# Patient Record
Sex: Female | Born: 1995 | Race: Black or African American | Hispanic: No | Marital: Single | Smoking: Never smoker
Health system: Southern US, Community
[De-identification: ages and names within clinical notes are randomized; demographics above are authoritative.]

## PROBLEM LIST (undated history)

## (undated) ENCOUNTER — Inpatient Hospital Stay (HOSPITAL_COMMUNITY): Payer: Self-pay

## (undated) DIAGNOSIS — I2699 Other pulmonary embolism without acute cor pulmonale: Secondary | ICD-10-CM

## (undated) DIAGNOSIS — R011 Cardiac murmur, unspecified: Secondary | ICD-10-CM

## (undated) DIAGNOSIS — D649 Anemia, unspecified: Secondary | ICD-10-CM

## (undated) DIAGNOSIS — D6949 Other primary thrombocytopenia: Secondary | ICD-10-CM

## (undated) HISTORY — DX: Other primary thrombocytopenia: D69.49

## (undated) HISTORY — PX: WISDOM TOOTH EXTRACTION: SHX21

## (undated) HISTORY — DX: Anemia, unspecified: D64.9

## (undated) HISTORY — DX: Cardiac murmur, unspecified: R01.1

---

## 2017-06-18 NOTE — L&D Delivery Note (Signed)
Delivery Note At  a viable female was delivered via  (Presentation:ROA ;  ).  APGAR:8 ,9 ; weight  .   Placenta status:Complete , . 3V Cord:  with the following complications:none .    Anesthesia:  Epidural Episiotomy:  N/A Lacerations:  None Suture Repair: 2.0 vicryl rapide Est. Blood Loss (mL):  150cc  Mom to postpartum.  Baby to Couplet care / Skin to Skin.  Diana Lowe 01/30/2018, 10:32 PM

## 2017-10-03 ENCOUNTER — Encounter (HOSPITAL_COMMUNITY): Payer: Self-pay | Admitting: *Deleted

## 2017-10-03 ENCOUNTER — Inpatient Hospital Stay (HOSPITAL_COMMUNITY)
Admission: AD | Admit: 2017-10-03 | Discharge: 2017-10-03 | Discharge status: Against Medical Advice | Payer: Self-pay | Source: Ambulatory Visit | Attending: Family Medicine | Admitting: Family Medicine

## 2017-10-03 DIAGNOSIS — O26892 Other specified pregnancy related conditions, second trimester: Secondary | ICD-10-CM | POA: Insufficient documentation

## 2017-10-03 DIAGNOSIS — Z5321 Procedure and treatment not carried out due to patient leaving prior to being seen by health care provider: Secondary | ICD-10-CM | POA: Insufficient documentation

## 2017-10-03 DIAGNOSIS — Z3A23 23 weeks gestation of pregnancy: Secondary | ICD-10-CM | POA: Insufficient documentation

## 2017-10-03 LAB — URINALYSIS, ROUTINE W REFLEX MICROSCOPIC
Bilirubin Urine: NEGATIVE
Glucose, UA: NEGATIVE mg/dL
HGB URINE DIPSTICK: NEGATIVE
KETONES UR: NEGATIVE mg/dL
Leukocytes, UA: NEGATIVE
NITRITE: NEGATIVE
PROTEIN: NEGATIVE mg/dL
Specific Gravity, Urine: 1.015 (ref 1.005–1.030)
pH: 8 (ref 5.0–8.0)

## 2017-10-03 NOTE — MAU Note (Signed)
[facility] Was told to come here to check on baby . Appointment on May 1. Denies any vag bleeding leaking or pain at this time.

## 2017-10-03 NOTE — MAU Note (Signed)
Pt unable to stay for evaluation. Told secretary she was leaving and would come back later.Left without signing AMA form

## 2017-10-22 ENCOUNTER — Encounter: Payer: Self-pay | Admitting: Student

## 2017-10-30 LAB — OB RESULTS CONSOLE RPR: RPR: NONREACTIVE

## 2017-11-19 LAB — OB RESULTS CONSOLE ABO/RH: RH Type: POSITIVE

## 2017-11-19 LAB — OB RESULTS CONSOLE HEPATITIS B SURFACE ANTIGEN: HEP B S AG: NEGATIVE

## 2017-11-19 LAB — OB RESULTS CONSOLE HIV ANTIBODY (ROUTINE TESTING): HIV: NONREACTIVE

## 2017-12-23 LAB — OB RESULTS CONSOLE GBS: GBS: POSITIVE

## 2018-01-29 ENCOUNTER — Telehealth (HOSPITAL_COMMUNITY): Payer: Self-pay | Admitting: *Deleted

## 2018-01-29 ENCOUNTER — Encounter (HOSPITAL_COMMUNITY): Payer: Self-pay | Admitting: *Deleted

## 2018-01-29 NOTE — Telephone Encounter (Signed)
Preadmission screen  

## 2018-01-30 ENCOUNTER — Encounter (HOSPITAL_COMMUNITY): Payer: Self-pay | Admitting: *Deleted

## 2018-01-30 ENCOUNTER — Inpatient Hospital Stay (HOSPITAL_COMMUNITY): Payer: Medicaid Other | Admitting: Anesthesiology

## 2018-01-30 ENCOUNTER — Inpatient Hospital Stay (HOSPITAL_COMMUNITY)
Admission: AD | Admit: 2018-01-30 | Discharge: 2018-02-01 | DRG: 807 | Disposition: A | Discharge status: Home / Self Care | Payer: Medicaid Other | Attending: Obstetrics and Gynecology | Admitting: Obstetrics and Gynecology

## 2018-01-30 ENCOUNTER — Other Ambulatory Visit: Payer: Self-pay

## 2018-01-30 DIAGNOSIS — D649 Anemia, unspecified: Secondary | ICD-10-CM | POA: Diagnosis present

## 2018-01-30 DIAGNOSIS — Z3A40 40 weeks gestation of pregnancy: Secondary | ICD-10-CM

## 2018-01-30 DIAGNOSIS — O358XX0 Maternal care for other (suspected) fetal abnormality and damage, not applicable or unspecified: Secondary | ICD-10-CM | POA: Diagnosis present

## 2018-01-30 DIAGNOSIS — O9902 Anemia complicating childbirth: Secondary | ICD-10-CM | POA: Diagnosis present

## 2018-01-30 DIAGNOSIS — O99824 Streptococcus B carrier state complicating childbirth: Principal | ICD-10-CM | POA: Diagnosis present

## 2018-01-30 DIAGNOSIS — Z3483 Encounter for supervision of other normal pregnancy, third trimester: Secondary | ICD-10-CM | POA: Diagnosis present

## 2018-01-30 LAB — CBC
HEMATOCRIT: 32.6 % — AB (ref 36.0–46.0)
Hemoglobin: 10.9 g/dL — ABNORMAL LOW (ref 12.0–15.0)
MCH: 28.8 pg (ref 26.0–34.0)
MCHC: 33.4 g/dL (ref 30.0–36.0)
MCV: 86 fL (ref 78.0–100.0)
Platelets: 151 10*3/uL (ref 150–400)
RBC: 3.79 MIL/uL — ABNORMAL LOW (ref 3.87–5.11)
RDW: 13.8 % (ref 11.5–15.5)
WBC: 10.7 10*3/uL — ABNORMAL HIGH (ref 4.0–10.5)

## 2018-01-30 LAB — TYPE AND SCREEN
ABO/RH(D): O POS
Antibody Screen: NEGATIVE

## 2018-01-30 LAB — ABO/RH: ABO/RH(D): O POS

## 2018-01-30 MED ORDER — ACETAMINOPHEN 325 MG PO TABS: 650.0000 mg | ORAL_TABLET | ORAL | Status: DC | PRN

## 2018-01-30 MED ORDER — FLEET ENEMA 7-19 GM/118ML RE ENEM: 1.0000 | ENEMA | RECTAL | Status: DC | PRN

## 2018-01-30 MED ORDER — FENTANYL 2.5 MCG/ML BUPIVACAINE 1/10 % EPIDURAL INFUSION (WH - ANES)
14.0000 mL/h | INTRAMUSCULAR | Status: DC | PRN
  Administered 2018-01-30: 14 mL/h via EPIDURAL
  Filled 2018-01-30: qty 100

## 2018-01-30 MED ORDER — OXYCODONE-ACETAMINOPHEN 5-325 MG PO TABS: 2.0000 | ORAL_TABLET | ORAL | Status: DC | PRN

## 2018-01-30 MED ORDER — PHENYLEPHRINE 40 MCG/ML (10ML) SYRINGE FOR IV PUSH (FOR BLOOD PRESSURE SUPPORT)
80.0000 ug | PREFILLED_SYRINGE | INTRAVENOUS | Status: DC | PRN
  Filled 2018-01-30: qty 5

## 2018-01-30 MED ORDER — TETANUS-DIPHTH-ACELL PERTUSSIS 5-2.5-18.5 LF-MCG/0.5 IM SUSP: 0.5000 mL | Freq: Once | INTRAMUSCULAR | Status: DC

## 2018-01-30 MED ORDER — ZOLPIDEM TARTRATE 5 MG PO TABS: 5.0000 mg | ORAL_TABLET | Freq: Every evening | ORAL | Status: DC | PRN

## 2018-01-30 MED ORDER — EPHEDRINE 5 MG/ML INJ
10.0000 mg | INTRAVENOUS | Status: DC | PRN
  Filled 2018-01-30: qty 2

## 2018-01-30 MED ORDER — OXYTOCIN 40 UNITS IN LACTATED RINGERS INFUSION - SIMPLE MED
2.5000 [IU]/h | INTRAVENOUS | Status: DC
  Administered 2018-01-30: 2.5 [IU]/h via INTRAVENOUS
  Filled 2018-01-30: qty 1000

## 2018-01-30 MED ORDER — DIBUCAINE 1 % RE OINT: 1.0000 "application " | TOPICAL_OINTMENT | RECTAL | Status: DC | PRN

## 2018-01-30 MED ORDER — IBUPROFEN 600 MG PO TABS
600.0000 mg | ORAL_TABLET | Freq: Four times a day (QID) | ORAL | Status: DC
  Administered 2018-01-31 – 2018-02-01 (×7): 600 mg via ORAL
  Filled 2018-01-30 (×7): qty 1

## 2018-01-30 MED ORDER — ONDANSETRON HCL 4 MG PO TABS: 4.0000 mg | ORAL_TABLET | ORAL | Status: DC | PRN

## 2018-01-30 MED ORDER — PENICILLIN G 3 MILLION UNITS IVPB - SIMPLE MED
3.0000 10*6.[IU] | INTRAVENOUS | Status: DC
  Administered 2018-01-30 (×2): 3 10*6.[IU] via INTRAVENOUS
  Filled 2018-01-30: qty 100
  Filled 2018-01-30 (×2): qty 3

## 2018-01-30 MED ORDER — ONDANSETRON HCL 4 MG/2ML IJ SOLN: 4.0000 mg | INTRAMUSCULAR | Status: DC | PRN

## 2018-01-30 MED ORDER — LACTATED RINGERS IV SOLN
INTRAVENOUS | Status: DC
  Administered 2018-01-30 (×2): via INTRAVENOUS

## 2018-01-30 MED ORDER — SOD CITRATE-CITRIC ACID 500-334 MG/5ML PO SOLN: 30.0000 mL | ORAL | Status: DC | PRN

## 2018-01-30 MED ORDER — WITCH HAZEL-GLYCERIN EX PADS: 1.0000 "application " | MEDICATED_PAD | CUTANEOUS | Status: DC | PRN

## 2018-01-30 MED ORDER — PRENATAL MULTIVITAMIN CH
1.0000 | ORAL_TABLET | Freq: Every day | ORAL | Status: DC
  Administered 2018-01-31: 1 via ORAL
  Filled 2018-01-30 (×2): qty 1

## 2018-01-30 MED ORDER — LACTATED RINGERS IV SOLN: 500.0000 mL | INTRAVENOUS | Status: DC | PRN

## 2018-01-30 MED ORDER — OXYCODONE-ACETAMINOPHEN 5-325 MG PO TABS: 1.0000 | ORAL_TABLET | ORAL | Status: DC | PRN

## 2018-01-30 MED ORDER — LIDOCAINE HCL (PF) 1 % IJ SOLN
INTRAMUSCULAR | Status: DC | PRN
  Administered 2018-01-30 (×2): 4 mL

## 2018-01-30 MED ORDER — LIDOCAINE HCL (PF) 1 % IJ SOLN
30.0000 mL | INTRAMUSCULAR | Status: DC | PRN
  Filled 2018-01-30: qty 30

## 2018-01-30 MED ORDER — BENZOCAINE-MENTHOL 20-0.5 % EX AERO
1.0000 "application " | INHALATION_SPRAY | CUTANEOUS | Status: DC | PRN
  Administered 2018-01-31: 1 via TOPICAL
  Filled 2018-01-30: qty 56

## 2018-01-30 MED ORDER — LACTATED RINGERS IV SOLN: 500.0000 mL | Freq: Once | INTRAVENOUS | Status: DC

## 2018-01-30 MED ORDER — SENNOSIDES-DOCUSATE SODIUM 8.6-50 MG PO TABS
2.0000 | ORAL_TABLET | ORAL | Status: DC
  Filled 2018-01-30 (×2): qty 2

## 2018-01-30 MED ORDER — SODIUM CHLORIDE 0.9 % IV SOLN
5.0000 10*6.[IU] | Freq: Once | INTRAVENOUS | Status: AC
  Administered 2018-01-30: 5 10*6.[IU] via INTRAVENOUS
  Filled 2018-01-30: qty 5

## 2018-01-30 MED ORDER — COCONUT OIL OIL
1.0000 "application " | TOPICAL_OIL | Status: DC | PRN
  Administered 2018-01-31: 1 via TOPICAL
  Filled 2018-01-30: qty 120

## 2018-01-30 MED ORDER — OXYTOCIN BOLUS FROM INFUSION
500.0000 mL | Freq: Once | INTRAVENOUS | Status: AC
  Administered 2018-01-30: 500 mL via INTRAVENOUS

## 2018-01-30 MED ORDER — DIPHENHYDRAMINE HCL 50 MG/ML IJ SOLN: 12.5000 mg | INTRAMUSCULAR | Status: DC | PRN

## 2018-01-30 MED ORDER — SIMETHICONE 80 MG PO CHEW: 80.0000 mg | CHEWABLE_TABLET | ORAL | Status: DC | PRN

## 2018-01-30 MED ORDER — ONDANSETRON HCL 4 MG/2ML IJ SOLN: 4.0000 mg | Freq: Four times a day (QID) | INTRAMUSCULAR | Status: DC | PRN

## 2018-01-30 MED ORDER — PHENYLEPHRINE 40 MCG/ML (10ML) SYRINGE FOR IV PUSH (FOR BLOOD PRESSURE SUPPORT)
80.0000 ug | PREFILLED_SYRINGE | INTRAVENOUS | Status: DC | PRN
  Filled 2018-01-30: qty 5
  Filled 2018-01-30: qty 10

## 2018-01-30 MED ORDER — DIPHENHYDRAMINE HCL 25 MG PO CAPS: 25.0000 mg | ORAL_CAPSULE | Freq: Four times a day (QID) | ORAL | Status: DC | PRN

## 2018-01-30 NOTE — Anesthesia Preprocedure Evaluation (Signed)
Anesthesia Evaluation  Patient identified by MRN, date of birth, ID band Patient awake    Reviewed: Allergy & Precautions, NPO status , Patient's Chart, lab work & pertinent test results  Airway Mallampati: II  TM Distance: >3 FB Neck ROM: Full    Dental   Pulmonary neg pulmonary ROS,    Pulmonary exam normal breath sounds clear to auscultation       Cardiovascular negative cardio ROS Normal cardiovascular exam Rhythm:Regular Rate:Normal     Neuro/Psych negative neurological ROS  negative psych ROS   GI/Hepatic negative GI ROS, Neg liver ROS,   Endo/Other  negative endocrine ROS  Renal/GU negative Renal ROS     Musculoskeletal negative musculoskeletal ROS (+)   Abdominal   Peds  Hematology  (+) anemia ,   Anesthesia Other Findings   Reproductive/Obstetrics (+) Pregnancy                             Anesthesia Physical Anesthesia Plan  ASA: II  Anesthesia Plan: Epidural   Post-op Pain Management:    Induction:   PONV Risk Score and Plan:   Airway Management Planned:   Additional Equipment:   Intra-op Plan:   Post-operative Plan:   Informed Consent: I have reviewed the patients History and Physical, chart, labs and discussed the procedure including the risks, benefits and alternatives for the proposed anesthesia with the patient or authorized representative who has indicated his/her understanding and acceptance.     Plan Discussed with:   Anesthesia Plan Comments:         Anesthesia Quick Evaluation

## 2018-01-30 NOTE — Anesthesia Pain Management Evaluation Note (Signed)
  CRNA Pain Management Visit Note  Patient: Diana Lowe, 22 y.o., female  "Hello I am a [facility] of the anesthesia [facility]. We have an anesthesia team available at all times to provide care throughout the hospital, including epidural management and anesthesia for C-section. I don't know your plan for the delivery whether it a natural birth, water birth, IV sedation, nitrous supplementation, doula or epidural, but we want to meet your pain goals."   1.Was your pain managed to your expectations on prior hospitalizations?   No prior hospitalizations  2.What is your expectation for pain management during this hospitalization?     Labor support without medications, Epidural and IV pain meds  3.How can we help you reach that goal? Be available;information given  Record the patient's initial score and the patient's pain goal.   Pain: 6  Pain Goal: 9 [facility] wants you to be able to say your pain was always managed very well.  Edison PaceWILKERSON,Makenize Messman 01/30/2018

## 2018-01-30 NOTE — H&P (Signed)
Diana Lowe is a 22 y.o. female presenting for labor. Pt had castor oil last night. Her contractions are q 5 minutes.  No Lof or vb OB History    Gravida  1   Para  0   Term  0   Preterm  0   AB  0   Living  0     SAB  0   TAB  0   Ectopic  0   Multiple  0   Live Births  0          Past Medical History:  Diagnosis Date  . Anemia   . Primary thrombocytopenia (HCC)    Past Surgical History:  Procedure Laterality Date  . WISDOM TOOTH EXTRACTION     Family History: family history includes Diabetes in her maternal grandmother; Hypertension in her maternal grandfather. Social History:  reports that she has never smoked. She has never used smokeless tobacco. She reports that she does not drink alcohol or use drugs.     Maternal Diabetes: No Genetic Screening: Normal Maternal Ultrasounds/Referrals: Normal Fetal Ultrasounds or other Referrals:  Fetal pyelectasis Maternal Substance Abuse:  No Significant Maternal Medications:  None Significant Maternal Lab Results:  None Other Comments:  None   Physical Examination: General appearance - alert, well appearing, and in no distress Chest - clear to auscultation, no wheezes, rales or rhonchi, symmetric air entry Heart - normal rate and regular rhythm Abdomen - soft, nontender, nondistended, no masses or organomegaly Extremities - Homan's sign negative bilaterally  ROS History Dilation: 4 Effacement (%): 80 Station: -2 Exam by:: Diana Lowe Blood pressure 109/62, pulse 64, temperature 98.5 F (36.9 C), temperature source Oral, resp. rate 18, height 5\' 3"  (1.6 m), weight 72.6 kg, SpO2 100 %. Exam Physical Exam  Prenatal labs: ABO, Rh: O/Positive/-- (06/04 0000) Antibody:   Rubella:   RPR: Nonreactive (05/15 0000)  HBsAg: Negative (06/04 0000)  HIV: Non-reactive (06/04 0000)  GBS: Positive (07/08 0000)   Assessment/Plan: Labor at term Will AROM once PCN in  Pt declines pain meds for  now Positive GBS Cat 1 Anticipate SVD   Jaspreet Bodner A 01/30/2018, 1:14 PM

## 2018-01-30 NOTE — MAU Note (Signed)
Pt presents with c/o since 0500 this morning.  Denies LOF or VB.  Reports +FM.

## 2018-01-30 NOTE — Anesthesia Procedure Notes (Signed)
Epidural Patient location during procedure: OB Start time: 01/30/2018 4:49 PM End time: 01/30/2018 5:02 PM  Staffing Anesthesiologist: Lewie LoronGermeroth, Xylah Early, MD Performed: anesthesiologist   Preanesthetic Checklist Completed: patient identified, pre-op evaluation, timeout performed, IV checked, risks and benefits discussed and monitors and equipment checked  Epidural Patient position: sitting Prep: site prepped and draped and DuraPrep Patient monitoring: heart rate, continuous pulse ox and blood pressure Approach: midline Location: L2-L3 Injection technique: LOR air and LOR saline  Needle:  Needle type: Tuohy  Needle gauge: 17 G Needle length: 9 cm Needle insertion depth: 6 cm Catheter type: closed end flexible Catheter size: 19 Gauge Catheter at skin depth: 11 cm Test dose: negative  Assessment Sensory level: T8 Events: blood not aspirated, injection not painful, no injection resistance, negative IV test and no paresthesia  Additional Notes Reason for block:procedure for pain

## 2018-01-30 NOTE — Progress Notes (Signed)
Patient ID: Diana Lowe, female   DOB: 04/23/1996, 22 y.o.   MRN: [id number] PT is having some pain with contractions BP (!) 94/49   Pulse 71   Temp 98.1 F (36.7 C) (Oral)   Resp 17   Ht 5\' 3"  (1.6 m)   Wt 72.6 kg   SpO2 100%   BMI 28.34 kg/m  Cat 1 4-5/90/-1 AROM clear Anticipate SVD

## 2018-01-31 ENCOUNTER — Other Ambulatory Visit: Payer: Self-pay

## 2018-01-31 LAB — CBC
HEMATOCRIT: 28.2 % — AB (ref 36.0–46.0)
HEMOGLOBIN: 9.8 g/dL — AB (ref 12.0–15.0)
MCH: 29.5 pg (ref 26.0–34.0)
MCHC: 34.8 g/dL (ref 30.0–36.0)
MCV: 84.9 fL (ref 78.0–100.0)
Platelets: 143 10*3/uL — ABNORMAL LOW (ref 150–400)
RBC: 3.32 MIL/uL — ABNORMAL LOW (ref 3.87–5.11)
RDW: 13.6 % (ref 11.5–15.5)
WBC: 15.3 10*3/uL — AB (ref 4.0–10.5)

## 2018-01-31 LAB — RPR: RPR: NONREACTIVE

## 2018-01-31 NOTE — Lactation Note (Signed)
This note was copied from a baby's chart. Lactation Consultation Note  Patient Name: Diana Lucretia KernShianne Wingate Luretha RuedFaison Today's Date: 01/31/2018 Reason for consult: Initial assessment;Primapara;1st time breastfeeding;Term  P1 mother whose infant is now 4214 hours old.    Baby sleeping in bassinet as I arrived.  Encouraged mother to feed 8-12 times/24 hours or sooner if baby shows feeding cues.  Reviewed cues.  Suggested hand expression before/after feeds and to save any EBM to feed back to baby.  Colostrum container provided and milk storage times discussed.  Baby started to awaken while I was in room so I  offered to assist with latching and mother accepted.  She stated that the left side is very sore and that both sides hurt when he feeds.  On my gloved finger baby sucked hard and had a very tight suck and small mouth.  Suck training initiated and explained to mother.  With much encouragement baby latched onto the left breast in the football hold.  Mother in pain and I attempted to do a chin tug which did not seem to relieve mother's pain.  Also, he wanted to suck on his upper lip and made sucking noises while at the breast.  Released baby and tried a couple other times without success.  Offered to try the cross cradle hold on the right breast with the same outcome.  Mother's nipples are intact and everted with no breakdown.  Nipples are rounded.  Put baby STS on mother's chest and will try again with the next feed.  Mother will continue to do suck training.  Mother was able to express a few colostrum drops which were fed back to baby.  She will call for latch assistance as needed and continue hand expression and manual pump to help express colostrum and stimulate breasts until baby latches better.  Reminded her to use EBM and coconut oil on nipple and areola.  RN updated.    Maternal Data Formula Feeding for Exclusion: No Has patient been taught Hand Expression?: Yes Does the patient have breastfeeding  experience prior to this delivery?: No  Feeding Feeding Type: Breast Fed  LATCH Score Latch: Repeated attempts needed to sustain latch, nipple held in mouth throughout feeding, stimulation needed to elicit sucking reflex.  Audible Swallowing: None  Type of Nipple: Everted at rest and after stimulation  Comfort (Breast/Nipple): Filling, red/small blisters or bruises, mild/mod discomfort  Hold (Positioning): Assistance needed to correctly position infant at breast and maintain latch.  LATCH Score: 5  Interventions Interventions: Breast feeding basics reviewed;Assisted with latch;Skin to skin;Breast massage;Hand express;Position options;Support pillows;Adjust position;Breast compression;Hand pump  Lactation Tools Discussed/Used     Consult Status Consult Status: Follow-up Date: 02/01/18 Follow-up type: In-patient    Dora SimsBeth R Sourish Allender 01/31/2018, 12:31 PM

## 2018-01-31 NOTE — Progress Notes (Signed)
Post Partum Day 1 Subjective: no complaints, up ad lib, voiding and tolerating PO  Objective: Blood pressure 113/75, pulse 66, temperature 98.7 F (37.1 C), temperature source Oral, resp. rate 18, height 5\' 3"  (1.6 m), weight 72.6 kg, SpO2 99 %, unknown if currently breastfeeding.  Physical Exam:  General: alert, cooperative and appears stated age Lochia: appropriate Uterine Fundus: firm Incision:  DVT Evaluation: No evidence of DVT seen on physical exam.  Recent Labs    01/30/18 1206 01/31/18 0548  HGB 10.9* 9.8*  HCT 32.6* 28.2*    Assessment/Plan: Plan for discharge tomorrow and Breastfeeding   LOS: 1 day   Tyshia Fenter A Bekim Werntz CNM 01/31/2018, 10:18 AM

## 2018-01-31 NOTE — Addendum Note (Signed)
Addendum  created 01/31/18 0806 by Rica Recordsickelton, Sequan Auxier, CRNA   Charge Capture section accepted, Sign clinical note

## 2018-01-31 NOTE — Anesthesia Postprocedure Evaluation (Signed)
Anesthesia Post Note  Patient: Diana Lowe  Procedure(s) Performed: AN AD HOC EPIDURAL     Patient location during evaluation: Mother Baby Anesthesia Type: Epidural Level of consciousness: awake and alert Pain management: pain level controlled Vital Signs Assessment: post-procedure vital signs reviewed and stable Respiratory status: spontaneous breathing, nonlabored ventilation and respiratory function stable Cardiovascular status: stable Postop Assessment: no headache, no backache, epidural receding and patient able to bend at knees Anesthetic complications: no    Last Vitals:  Vitals:   01/31/18 0100 01/31/18 0506  BP: 113/64 105/69  Pulse: (!) 56 (!) 59  Resp: 16 16  Temp: 36.6 C 36.8 C  SpO2: 99% 99%    Last Pain:  Vitals:   01/31/18 0613  TempSrc:   PainSc: 4    Pain Goal: Patients Stated Pain Goal: 3 (01/30/18 1151)               Rica RecordsICKELTON,Rolonda Pontarelli

## 2018-02-01 MED ORDER — FERROUS SULFATE 325 (65 FE) MG PO TABS: 325.0000 mg | ORAL_TABLET | Freq: Every day | ORAL | Status: DC

## 2018-02-01 MED ORDER — IBUPROFEN 600 MG PO TABS: 600.0000 mg | ORAL_TABLET | Freq: Four times a day (QID) | ORAL | 0 refills | Status: DC | PRN

## 2018-02-01 MED ORDER — FERROUS SULFATE 325 (65 FE) MG PO TABS: 325.0000 mg | ORAL_TABLET | Freq: Every day | ORAL | 0 refills | Status: DC

## 2018-02-01 NOTE — Discharge Instructions (Signed)
Postpartum Care After Vaginal Delivery ° °The period of time right after you deliver your newborn is called the postpartum period. °What kind of medical care will I receive? °· You may continue to receive fluids and medicines through an IV tube inserted into one of your veins. °· If an incision was made near your vagina (episiotomy) or if you had some vaginal tearing during delivery, cold compresses may be placed on your episiotomy or your tear. This helps to reduce pain and swelling. °· You may be given a squirt bottle to use when you go to the bathroom. You may use this until you are comfortable wiping as usual. To use the squirt bottle, follow these steps: °? Before you urinate, fill the squirt bottle with warm water. Do not use hot water. °? After you urinate, while you are sitting on the toilet, use the squirt bottle to rinse the area around your urethra and vaginal opening. This rinses away any urine and blood. °? You may do this instead of wiping. As you start healing, you may use the squirt bottle before wiping yourself. Make sure to wipe gently. °? Fill the squirt bottle with clean water every time you use the bathroom. °· You will be given sanitary pads to wear. °How can I expect to feel? °· You may not feel the need to urinate for several hours after delivery. °· You will have some soreness and pain in your abdomen and vagina. °· If you are breastfeeding, you may have uterine contractions every time you breastfeed for up to several weeks postpartum. Uterine contractions help your uterus return to its normal size. °· It is normal to have vaginal bleeding (lochia) after delivery. The amount and appearance of lochia is often similar to a menstrual period in the first week after delivery. It will gradually decrease over the next few weeks to a dry, yellow-brown discharge. For most women, lochia stops completely by 6-8 weeks after delivery. Vaginal bleeding can vary from woman to woman. °· Within the first few  days after delivery, you may have breast engorgement. This is when your breasts feel heavy, full, and uncomfortable. Your breasts may also throb and feel hard, tightly stretched, warm, and tender. After this occurs, you may have milk leaking from your breasts. Your health care provider can help you relieve discomfort due to breast engorgement. Breast engorgement should go away within a few days. °· You may feel more sad or worried than normal due to hormonal changes after delivery. These feelings should not last more than a few days. If these feelings do not go away after several days, speak with your health care provider. °How should I care for myself? °· Tell your health care provider if you have pain or discomfort. °· Drink enough water to keep your urine clear or pale yellow. °· Wash your hands thoroughly with soap and water for at least 20 seconds after changing your sanitary pads, after using the toilet, and before holding or feeding your baby. °· If you are not breastfeeding, avoid touching your breasts a lot. Doing this can make your breasts produce more milk. °· If you become weak or lightheaded, or you feel like you might faint, ask for help before: °? Getting out of bed. °? Showering. °· Change your sanitary pads frequently. Watch for any changes in your flow, such as a sudden increase in volume, a change in color, the passing of large blood clots. If you pass a blood clot from your vagina,   save it to show to your health care provider. Do not flush blood clots down the toilet without having your health care provider look at them. °· Make sure that all your vaccinations are up to date. This can help protect you and your baby from getting certain diseases. You may need to have immunizations done before you leave the hospital. °· If desired, talk with your health care provider about methods of family planning or birth control (contraception). °How can I start bonding with my baby? °Spending as much time as  possible with your baby is very important. During this time, you and your baby can get to know each other and develop a bond. Having your baby stay with you in your room (rooming in) can give you time to get to know your baby. Rooming in can also help you become comfortable caring for your baby. Breastfeeding can also help you bond with your baby. °How can I plan for returning home with my baby? °· Make sure that you have a car seat installed in your vehicle. °? Your car seat should be checked by a certified car seat installer to make sure that it is installed safely. °? Make sure that your baby fits into the car seat safely. °· Ask your health care provider any questions you have about caring for yourself or your baby. Make sure that you are able to contact your health care provider with any questions after leaving the hospital. °This information is not intended to replace advice given to you by your health care provider. Make sure you discuss any questions you have with your health care provider. °Document Released: 04/01/2007 Document Revised: 11/07/2015 Document Reviewed: 05/09/2015 °Elsevier Interactive Patient Education © 2018 [employer] ° ° °Postpartum Depression and Baby Blues °The postpartum period begins right after the birth of a baby. During this time, there is often a great amount of joy and excitement. It is also a time of many changes in the life of the parents. Regardless of how many times a mother gives birth, each child brings new challenges and dynamics to the family. It is not unusual to have feelings of excitement along with confusing shifts in moods, emotions, and thoughts. All mothers are at risk of developing postpartum depression or the "baby blues." These mood changes can occur right after giving birth, or they may occur many months after giving birth. The baby blues or postpartum depression can be mild or severe. Additionally, postpartum depression can go away rather quickly, or it can  be a long-term condition. °What are the causes? °Raised hormone levels and the rapid drop in those levels are thought to be a main cause of postpartum depression and the baby blues. A number of hormones change during and after pregnancy. Estrogen and progesterone usually decrease right after the delivery of your baby. The levels of thyroid hormone and various cortisol steroids also rapidly drop. Other factors that play a role in these mood changes include major life events and genetics. °What increases the risk? °If you have any of the following risks for the baby blues or postpartum depression, know what symptoms to watch out for during the postpartum period. Risk factors that may increase the likelihood of getting the baby blues or postpartum depression include: °· Having a personal or family history of depression. °· Having depression while being pregnant. °· Having premenstrual mood issues or mood issues related to oral contraceptives. °· Having a lot of life stress. °· Having marital conflict. °· Lacking   a social support network. °· Having a baby with special needs. °· Having health problems, such as diabetes. ° °What are the signs or symptoms? °Symptoms of baby blues include: °· Brief changes in mood, such as going from extreme happiness to sadness. °· Decreased concentration. °· Difficulty sleeping. °· Crying spells, tearfulness. °· Irritability. °· Anxiety. ° °Symptoms of postpartum depression typically begin within the first month after giving birth. These symptoms include: °· Difficulty sleeping or excessive sleepiness. °· Marked weight loss. °· Agitation. °· Feelings of worthlessness. °· Lack of interest in activity or food. ° °Postpartum psychosis is a very serious condition and can be dangerous. Fortunately, it is rare. Displaying any of the following symptoms is cause for immediate medical attention. Symptoms of postpartum psychosis include: °· Hallucinations and delusions. °· Bizarre or disorganized  behavior. °· Confusion or disorientation. ° °How is this diagnosed? °A diagnosis is made by an evaluation of your symptoms. There are no medical or lab tests that lead to a diagnosis, but there are various questionnaires that a health care provider may use to identify those with the baby blues, postpartum depression, or psychosis. Often, a screening tool called the Edinburgh Postnatal Depression Scale is used to diagnose depression in the postpartum period. °How is this treated? °The baby blues usually goes away on its own in 1-2 weeks. Social support is often all that is needed. You will be encouraged to get adequate sleep and rest. Occasionally, you may be given medicines to help you sleep. °Postpartum depression requires treatment because it can last several months or longer if it is not treated. Treatment may include individual or group therapy, medicine, or both to address any social, physiological, and psychological factors that may play a role in the depression. Regular exercise, a healthy diet, rest, and social support may also be strongly recommended. °Postpartum psychosis is more serious and needs treatment right away. Hospitalization is often needed. °Follow these instructions at home: °· Get as much rest as you can. Nap when the baby sleeps. °· Exercise regularly. Some women find yoga and walking to be beneficial. °· Eat a balanced and nourishing diet. °· Do little things that you enjoy. Have a cup of tea, take a bubble bath, read your favorite magazine, or listen to your favorite music. °· Avoid alcohol. °· Ask for help with household chores, cooking, grocery shopping, or running errands as needed. Do not try to do everything. °· Talk to people close to you about how you are feeling. Get support from your partner, family members, friends, or other new moms. °· Try to stay positive in how you think. Think about the things you are grateful for. °· Do not spend a lot of time alone. °· Only take  over-the-counter or prescription medicine as directed by your health care provider. °· Keep all your postpartum appointments. °· Let your health care provider know if you have any concerns. °Contact a health care provider if: °You are having a reaction to or problems with your medicine. °Get help right away if: °· You have suicidal feelings. °· You think you may harm the baby or someone else. °This information is not intended to replace advice given to you by your health care provider. Make sure you discuss any questions you have with your health care provider. °Document Released: 03/08/2004 Document Revised: 11/10/2015 Document Reviewed: 03/16/2013 °Elsevier Interactive Patient Education © 2017 [employer] ° ° °Contraception Choices °Contraception, also called birth control, means things to use or ways to try   not to get pregnant. °Hormonal birth control °This kind of birth control uses hormones. Here are some types of hormonal birth control: °· A tube that is put under skin of the arm (implant). The tube can stay in for as long as 3 years. °· Shots to get every 3 months (injections). °· Pills to take every day (birth control pills). °· A patch to change 1 time each week for 3 weeks (birth control patch). After that, the patch is taken off for 1 week. °· A ring to put in the vagina. The ring is left in for 3 weeks. Then it is taken out of the vagina for 1 week. Then a new ring is put in. °· Pills to take after unprotected sex (emergency birth control pills). ° °Barrier birth control °Here are some types of barrier birth control: °· A thin covering that is put on the penis before sex (female condom). The covering is thrown away after sex. °· A soft, loose covering that is put in the vagina before sex (female condom). The covering is thrown away after sex. °· A rubber bowl that sits over the cervix (diaphragm). The bowl must be made for you. The bowl is put into the vagina before sex. The bowl is left in for 6-8 hours  after sex. It is taken out within 24 hours. °· A small, soft cup that fits over the cervix (cervical cap). The cup must be made for you. The cup can be left in for 6-8 hours after sex. It is taken out within 48 hours. °· A sponge that is put into the vagina before sex. It must be left in for at least 6 hours after sex. It must be taken out within 30 hours. Then it is thrown away. °· A chemical that kills or stops sperm from getting into the uterus (spermicide). It may be a pill, cream, jelly, or foam to put in the vagina. The chemical should be used at least 10-15 minutes before sex. ° °IUD (intrauterine) birth control °An IUD is a small, T-shaped piece of plastic. It is put inside the uterus. There are two kinds: °· Hormone IUD. This kind can stay in for 3-5 years. °· Copper IUD. This kind can stay in for 10 years. ° °Permanent birth control °Here are some types of permanent birth control: °· Surgery to block the fallopian tubes. °· Having an insert put into each fallopian tube. °· Surgery to tie off the tubes that carry sperm (vasectomy). ° °Natural planning birth control °Here are some types of natural planning birth control: °· Not having sex on the days the woman could get pregnant. °· Using a calendar: °? To keep track of the length of each period. °? To find out what days pregnancy can happen. °? To plan to not have sex on days when pregnancy can happen. °· Watching for symptoms of ovulation and not having sex during ovulation. One way the woman can check for ovulation is to check her temperature. °· Waiting to have sex until after ovulation. ° °Summary °· Contraception, also called birth control, means things to use or ways to try not to get pregnant. °· Hormonal methods of birth control include implants, injections, pills, patches, vaginal rings, and emergency birth control pills. °· Barrier methods of birth control can include female condoms, female condoms, diaphragms, cervical caps, sponges, and  spermicides. °· There are two types of IUD (intrauterine device) birth control. An IUD can be put in a woman's uterus   to prevent pregnancy for 3-5 years. °· Permanent sterilization can be done through a procedure for males, females, or both. °· Natural planning methods involve not having sex on the days when the woman could get pregnant. °This information is not intended to replace advice given to you by your health care provider. Make sure you discuss any questions you have with your health care provider. °Document Released: 04/01/2009 Document Revised: 06/14/2016 Document Reviewed: 06/14/2016 °Elsevier Interactive Patient Education © 2017 [employer]. ° °

## 2018-02-01 NOTE — Lactation Note (Addendum)
This note was copied from a baby's chart. Lactation Consultation Note  Patient Name: Diana Lucretia KernShianne Wingate Luretha RuedFaison WUJWJ'XToday's Date: 02/01/2018   Baby 35 hours old and sleeping.  Parents states stools transitioning to green and baby cluster fed last night.  Reassured parents  Mother is not using nipple shield and working on deeper latch and flanging lips. Mom has my # to call for assist w/next feeding if needed.  Mom encouraged to feed baby 8-12 times/24 hours and with feeding cues.  Reviewed engorgement care and monitoring voids/stools. Mother is using ebm and coconut oil for sore nipples. Answered questions about pumping.  Mother has manual pump.   Maternal Data    Feeding    LATCH Score                   Interventions    Lactation Tools Discussed/Used     Consult Status      Diana Lowe, Ruth [facility] 02/01/2018, 9:06 AM

## 2018-02-01 NOTE — Discharge Summary (Signed)
OB Discharge Summary     Patient Name: Diana Lowe DOB: 05/16/1996 MRN: [id number]  Date of admission: 01/30/2018 Delivering MD: Kenney HousemanPROTHERO, NANCY JEAN   Date of discharge: 02/01/2018  Admitting diagnosis: 40WKS,CTX Intrauterine pregnancy: 5571w3d     Secondary diagnosis:  Active Problems:   Normal labor   SVD (spontaneous vaginal delivery)  Discharge diagnosis: Term Pregnancy Delivered                                                                                                Post partum procedures:n/a  Augmentation: AROM  Complications: None  Hospital course:  Onset of Labor With Vaginal Delivery     22 y.o. yo G1P1001 at 171w3d was admitted in Latent Labor on 01/30/2018. Patient had an uncomplicated labor course as follows:  Membrane Rupture Time/Date: 2:59 PM ,01/30/2018   Intrapartum Procedures: Episiotomy: None [1]                                         Lacerations:  Periurethral [8]  Patient had a delivery of a Viable infant. 01/30/2018  Information for the patient's newborn:  Wingate Alphia KavaFaison, Boy Mireyah [[id number]][[id number]]  Delivery Method: Vaginal, Spontaneous(Filed from Delivery Summary)    Pateint had an uncomplicated postpartum course.  She is ambulating, tolerating a regular diet, passing flatus, and urinating well. Patient is discharged home in stable condition on 02/01/18.   Physical exam  Vitals:   01/31/18 0942 01/31/18 1655 01/31/18 2239 02/01/18 0524  BP: 113/75 115/74 128/72 107/65  Pulse: 66 (!) 57 (!) 58 (!) 52  Resp: 18 18 18 16   Temp: 98.7 F (37.1 C) 98.5 F (36.9 C) 98.4 F (36.9 C) 98.4 F (36.9 C)  TempSrc: Oral Oral Oral Oral  SpO2:      Weight:      Height:       General: alert, cooperative and no distress Lochia: appropriate Uterine Fundus: firm Incision: N/A DVT Evaluation: No evidence of DVT seen on physical exam. Negative Homan's sign. No cords or calf tenderness. No significant calf/ankle edema. Labs: Lab Results   Component Value Date   WBC 15.3 (H) 01/31/2018   HGB 9.8 (L) 01/31/2018   HCT 28.2 (L) 01/31/2018   MCV 84.9 01/31/2018   PLT 143 (L) 01/31/2018   No flowsheet data found.  Discharge instruction: per After Visit Summary and "Baby and Me Booklet".  After visit meds:  Allergies as of 02/01/2018   No Known Allergies     Medication List    TAKE these medications   ferrous sulfate 325 (65 FE) MG tablet Take 1 tablet (325 mg total) by mouth daily with breakfast. Start taking on:  02/02/2018   ibuprofen 600 MG tablet Commonly known as:  ADVIL,MOTRIN Take 1 tablet (600 mg total) by mouth every 6 (six) hours as needed for mild pain or moderate pain.   prenatal multivitamin Tabs tablet Take 1 tablet by mouth daily at 12 noon.       Diet: routine  diet. Patient with mild asymptomatic anemia, will rx daily Iron. Patient may take over the counter stool softener for constipation.   Activity: Advance as tolerated. Pelvic rest for 6 weeks.   Outpatient follow up:6 weeks Follow up Appt:No future appointments. Follow up Visit:No follow-ups on file.  Postpartum contraception: Undecided  Newborn Data: Live born female  Birth Weight: 9 lb 4.5 oz (4210 g) APGAR: 8, 9  Newborn Delivery   Birth date/time:  01/30/2018 21:39:00 Delivery type:  Vaginal, Spontaneous     Baby Feeding: Breast Disposition:home with mother   02/01/2018 Janeece RiggersEllis K Idil Maslanka, CNM

## 2018-02-03 ENCOUNTER — Inpatient Hospital Stay (HOSPITAL_COMMUNITY)
Admission: EM | Admit: 2018-02-03 | Discharge: 2018-02-04 | DRG: 776 | Disposition: A | Discharge status: Home / Self Care | Payer: Medicaid Other | Attending: Obstetrics and Gynecology | Admitting: Obstetrics and Gynecology

## 2018-02-03 ENCOUNTER — Encounter (HOSPITAL_COMMUNITY): Payer: Self-pay | Admitting: *Deleted

## 2018-02-03 ENCOUNTER — Other Ambulatory Visit: Payer: Self-pay | Admitting: Oncology

## 2018-02-03 ENCOUNTER — Emergency Department (HOSPITAL_COMMUNITY): Payer: Medicaid Other

## 2018-02-03 ENCOUNTER — Inpatient Hospital Stay (HOSPITAL_COMMUNITY): Payer: Medicaid Other

## 2018-02-03 ENCOUNTER — Other Ambulatory Visit: Payer: Self-pay

## 2018-02-03 ENCOUNTER — Encounter: Payer: Self-pay | Admitting: Advanced Practice Midwife

## 2018-02-03 DIAGNOSIS — O1495 Unspecified pre-eclampsia, complicating the puerperium: Secondary | ICD-10-CM | POA: Diagnosis not present

## 2018-02-03 DIAGNOSIS — R109 Unspecified abdominal pain: Secondary | ICD-10-CM

## 2018-02-03 DIAGNOSIS — O1490 Unspecified pre-eclampsia, unspecified trimester: Secondary | ICD-10-CM | POA: Diagnosis present

## 2018-02-03 DIAGNOSIS — I2699 Other pulmonary embolism without acute cor pulmonale: Secondary | ICD-10-CM

## 2018-02-03 DIAGNOSIS — O8823 Thromboembolism in the puerperium: Secondary | ICD-10-CM | POA: Insufficient documentation

## 2018-02-03 LAB — BASIC METABOLIC PANEL
Anion gap: 9 (ref 5–15)
BUN: 11 mg/dL (ref 6–20)
CO2: 25 mmol/L (ref 22–32)
CREATININE: 0.75 mg/dL (ref 0.44–1.00)
Calcium: 9.3 mg/dL (ref 8.9–10.3)
Chloride: 106 mmol/L (ref 98–111)
GLUCOSE: 80 mg/dL (ref 70–99)
Potassium: 3.9 mmol/L (ref 3.5–5.1)
SODIUM: 140 mmol/L (ref 135–145)

## 2018-02-03 LAB — URINALYSIS, ROUTINE W REFLEX MICROSCOPIC
Bilirubin Urine: NEGATIVE
GLUCOSE, UA: NEGATIVE mg/dL
Ketones, ur: NEGATIVE mg/dL
Nitrite: NEGATIVE
PH: 8 (ref 5.0–8.0)
Protein, ur: NEGATIVE mg/dL
RBC / HPF: >50 RBC/hpf — ABNORMAL HIGH (ref 0–5)
Specific Gravity, Urine: 1.004 — ABNORMAL LOW (ref 1.005–1.030)

## 2018-02-03 LAB — HEPATIC FUNCTION PANEL
ALK PHOS: 188 U/L — AB (ref 38–126)
ALT: 33 U/L (ref 0–44)
AST: 46 U/L — ABNORMAL HIGH (ref 15–41)
Albumin: 3 g/dL — ABNORMAL LOW (ref 3.5–5.0)
BILIRUBIN DIRECT: 0.2 mg/dL (ref 0.0–0.2)
BILIRUBIN INDIRECT: 0.5 mg/dL (ref 0.3–0.9)
Total Bilirubin: 0.7 mg/dL (ref 0.3–1.2)
Total Protein: 6.3 g/dL — ABNORMAL LOW (ref 6.5–8.1)

## 2018-02-03 LAB — PROTIME-INR
INR: 0.9
Prothrombin Time: 12.1 seconds (ref 11.4–15.2)

## 2018-02-03 LAB — PROTEIN / CREATININE RATIO, URINE
CREATININE, URINE: 14.48 mg/dL
Protein Creatinine Ratio: 1.38 mg/mg{Cre} — ABNORMAL HIGH (ref 0.00–0.15)
Total Protein, Urine: 20 mg/dL

## 2018-02-03 LAB — I-STAT TROPONIN, ED: Troponin i, poc: 0.02 ng/mL (ref 0.00–0.08)

## 2018-02-03 LAB — CBC
HCT: 34.7 % — ABNORMAL LOW (ref 36.0–46.0)
Hemoglobin: 11.5 g/dL — ABNORMAL LOW (ref 12.0–15.0)
MCH: 28.1 pg (ref 26.0–34.0)
MCHC: 33.1 g/dL (ref 30.0–36.0)
MCV: 84.8 fL (ref 78.0–100.0)
Platelets: 188 10*3/uL (ref 150–400)
RBC: 4.09 MIL/uL (ref 3.87–5.11)
RDW: 13.7 % (ref 11.5–15.5)
WBC: 7.4 10*3/uL (ref 4.0–10.5)

## 2018-02-03 LAB — BRAIN NATRIURETIC PEPTIDE: B NATRIURETIC PEPTIDE 5: 393.8 pg/mL — AB (ref 0.0–100.0)

## 2018-02-03 LAB — APTT: aPTT: 28 seconds (ref 24–36)

## 2018-02-03 LAB — D-DIMER, QUANTITATIVE: D-Dimer, Quant: 2 ug/mL-FEU — ABNORMAL HIGH (ref 0.00–0.50)

## 2018-02-03 MED ORDER — SIMETHICONE 80 MG PO CHEW: 80.0000 mg | CHEWABLE_TABLET | ORAL | Status: DC | PRN

## 2018-02-03 MED ORDER — ACETAMINOPHEN 325 MG PO TABS
650.0000 mg | ORAL_TABLET | Freq: Once | ORAL | Status: AC
  Administered 2018-02-03: 650 mg via ORAL
  Filled 2018-02-03: qty 2

## 2018-02-03 MED ORDER — HEPARIN BOLUS VIA INFUSION
4000.0000 [IU] | Freq: Once | INTRAVENOUS | Status: AC
  Administered 2018-02-03: 4000 [IU] via INTRAVENOUS
  Filled 2018-02-03: qty 4000

## 2018-02-03 MED ORDER — ACETAMINOPHEN 325 MG PO TABS: 650.0000 mg | ORAL_TABLET | ORAL | Status: DC | PRN

## 2018-02-03 MED ORDER — BENZOCAINE-MENTHOL 20-0.5 % EX AERO: 1.0000 "application " | INHALATION_SPRAY | CUTANEOUS | Status: DC | PRN

## 2018-02-03 MED ORDER — IOPAMIDOL (ISOVUE-370) INJECTION 76%
100.0000 mL | Freq: Once | INTRAVENOUS | Status: AC | PRN
  Administered 2018-02-03: 81 mL via INTRAVENOUS

## 2018-02-03 MED ORDER — OXYCODONE-ACETAMINOPHEN 5-325 MG PO TABS: 2.0000 | ORAL_TABLET | ORAL | Status: DC | PRN

## 2018-02-03 MED ORDER — DIPHENHYDRAMINE HCL 25 MG PO CAPS: 25.0000 mg | ORAL_CAPSULE | Freq: Four times a day (QID) | ORAL | Status: DC | PRN

## 2018-02-03 MED ORDER — SODIUM CHLORIDE 0.9 % IV SOLN: 250.0000 mL | INTRAVENOUS | Status: DC | PRN

## 2018-02-03 MED ORDER — NIFEDIPINE ER OSMOTIC RELEASE 30 MG PO TB24
30.0000 mg | ORAL_TABLET | Freq: Once | ORAL | Status: AC
  Administered 2018-02-03: 30 mg via ORAL
  Filled 2018-02-03: qty 1

## 2018-02-03 MED ORDER — IOPAMIDOL (ISOVUE-370) INJECTION 76%
INTRAVENOUS | Status: AC
  Filled 2018-02-03: qty 100

## 2018-02-03 MED ORDER — ONDANSETRON HCL 4 MG/2ML IJ SOLN: 4.0000 mg | INTRAMUSCULAR | Status: DC | PRN

## 2018-02-03 MED ORDER — HEPARIN (PORCINE) IN NACL 100-0.45 UNIT/ML-% IJ SOLN
13.5000 [IU]/h | INTRAMUSCULAR | Status: DC
  Administered 2018-02-03: 1100 [IU]/h via INTRAVENOUS
  Filled 2018-02-03: qty 250

## 2018-02-03 MED ORDER — ZOLPIDEM TARTRATE 5 MG PO TABS: 5.0000 mg | ORAL_TABLET | Freq: Every evening | ORAL | Status: DC | PRN

## 2018-02-03 MED ORDER — SODIUM CHLORIDE 0.9% FLUSH: 3.0000 mL | INTRAVENOUS | Status: DC | PRN

## 2018-02-03 MED ORDER — SODIUM CHLORIDE 0.9% FLUSH: 3.0000 mL | Freq: Two times a day (BID) | INTRAVENOUS | Status: DC

## 2018-02-03 MED ORDER — OXYCODONE-ACETAMINOPHEN 5-325 MG PO TABS: 1.0000 | ORAL_TABLET | ORAL | Status: DC | PRN

## 2018-02-03 MED ORDER — DIBUCAINE 1 % RE OINT: 1.0000 "application " | TOPICAL_OINTMENT | RECTAL | Status: DC | PRN

## 2018-02-03 MED ORDER — COCONUT OIL OIL: 1.0000 "application " | TOPICAL_OIL | Status: DC | PRN

## 2018-02-03 MED ORDER — WITCH HAZEL-GLYCERIN EX PADS: 1.0000 "application " | MEDICATED_PAD | CUTANEOUS | Status: DC | PRN

## 2018-02-03 MED ORDER — PRENATAL MULTIVITAMIN CH: 1.0000 | ORAL_TABLET | Freq: Every day | ORAL | Status: DC

## 2018-02-03 MED ORDER — SENNOSIDES-DOCUSATE SODIUM 8.6-50 MG PO TABS
2.0000 | ORAL_TABLET | ORAL | Status: DC
  Administered 2018-02-03: 1 via ORAL
  Filled 2018-02-03: qty 2

## 2018-02-03 MED ORDER — ONDANSETRON HCL 4 MG PO TABS: 4.0000 mg | ORAL_TABLET | ORAL | Status: DC | PRN

## 2018-02-03 NOTE — Plan of Care (Signed)
Pt. Condition will continue to improve 

## 2018-02-03 NOTE — ED Notes (Signed)
Bed: WA21 Expected date:  Expected time:  Means of arrival:  Comments: Triage 1 

## 2018-02-03 NOTE — Progress Notes (Signed)
ANTICOAGULATION CONSULT NOTE - Initial Consult  Pharmacy Consult for Heparin  Indication: Postpartum Pulmonary Embolus No Known Allergies  Patient Measurements: Height: 5\' 3"  (160 cm) Weight: 147 lb (66.7 kg) IBW/kg (Calculated) : 52.4   Vital Signs: Temp: 98.6 F (37 C) (08/19 1550) Temp Source: Oral (08/19 1550) BP: 130/94 (08/19 1550) Pulse Rate: 64 (08/19 1550)  Labs: Recent Labs    02/03/18 0630  HGB 11.5*  HCT 34.7*  PLT 188  APTT 28  LABPROT 12.1  INR 0.90  CREATININE 0.75    Estimated Creatinine Clearance: 99.5 mL/min (by C-G formula based on SCr of 0.75 mg/dL).   Medical History: Past Medical History:  Diagnosis Date  . Anemia   . Primary thrombocytopenia (HCC)     Medications:    Assessment: 22yo s/p SVD on 8/15 admitted today with postpartum Left sided PE per Spiral CT. [facility], back pain and dizziness. Plan per MD bote states Heparin drip x 24 hours then convert to Lovenox  Goal of Therapy:  Heparin level goal 0.3-0.7. Monitor platelets by anticoagulation protocol: Yes   Plan:  Heparin bolus 4000 units x 1 (~ 60 units/kg) Then start Heparin drip at 1100 units/hr ( ~ 17units/kg/hr). Will plan to draw 6 hour heparin level ~ 2300 and adjust to maintain desired level.   Claybon Jabsngel, Emmy Keng G 02/03/2018,5:10 PM

## 2018-02-03 NOTE — ED Notes (Signed)
Patient transported to X-ray 

## 2018-02-03 NOTE — Consult Note (Signed)
This note was interrupted by a storm and had to be redone

## 2018-02-03 NOTE — ED Notes (Signed)
US at bedside

## 2018-02-03 NOTE — ED Provider Notes (Addendum)
[place] DEPT Provider Note   CSN: [id number] Arrival date & time: 02/03/18  5400     History   Chief Complaint Chief Complaint  Patient presents with  . Chest Pain  . Dizziness    HPI Diana Lowe Diana Lowe is a 22 y.o. female.  HPI 22 year old G61, P94 female postpartum day 4 comes in with chief complaint of chest pain, back pain and dizziness. Patient reports that she was awake with her baby at 5 AM and she started suffering with chest discomfort.  Her chest pain is described as heaviness and pressure and it is located midsternally in the upper part of her chest.  The pain is radiating to her back, and towards her neck.  Patient denies any associated nausea, vomiting, fevers, chills, cough, shortness of breath.  Review of system is positive for dizziness and a mild headache (3/10).  Patient denies any abdominal pain or UTI-like symptoms.  Patient is noted to have elevated blood pressure.  She reports that her pregnancy was uncomplicated and she delivered vaginally at term.  Patient does not have any history of essential hypertension and she denies any substance abuse history.  Past Medical History:  Diagnosis Date  . Anemia   . Primary thrombocytopenia [facility])     Patient Active Problem List   Diagnosis Date Noted  . Preeclampsia 02/03/2018  . Normal labor 01/30/2018  . SVD (spontaneous vaginal delivery) 01/30/2018    Past Surgical History:  Procedure Laterality Date  . WISDOM TOOTH EXTRACTION       OB History    Gravida  1   Para  1   Term  1   Preterm  0   AB  0   Living  1     SAB  0   TAB  0   Ectopic  0   Multiple  0   Live Births  1            Home Medications    Prior to Admission medications   Medication Sig Start Date End Date Taking? Authorizing Provider  ferrous sulfate 325 (65 FE) MG tablet Take 1 tablet (325 mg total) by mouth daily with breakfast. 02/02/18  Yes Marikay Alar, CNM  ibuprofen  (ADVIL,MOTRIN) 600 MG tablet Take 1 tablet (600 mg total) by mouth every 6 (six) hours as needed for mild pain or moderate pain. 02/01/18  Yes Marikay Alar, CNM  Prenatal Vit-Fe Fumarate-FA (PRENATAL MULTIVITAMIN) TABS tablet Take 1 tablet by mouth daily at 12 noon.   Yes [provider]    Family History Family History  Problem Relation Age of Onset  . Diabetes Maternal Grandmother   . Hypertension Maternal Grandfather     Social History Social History   Tobacco Use  . Smoking status: Never Smoker  . Smokeless tobacco: Never Used  Substance Use Topics  . Alcohol use: Never    Frequency: Never  . Drug use: Never     Allergies   Patient has no known allergies.   Review of Systems Review of Systems  Constitutional: Positive for activity change.  Respiratory: Positive for shortness of breath.   Cardiovascular: Positive for chest pain.  Neurological: Positive for dizziness and headaches.     Physical Exam Updated Vital Signs BP (!) 145/93   Pulse (!) 57   Temp 98.6 F (37 C) (Oral)   Resp 18   Ht _0  (1.6 m)   Wt 67 kg   SpO2  100%   Breastfeeding? Yes Comment: DOD 01/30/2018  BMI 26.18 kg/m   Physical Exam  Constitutional: She is oriented to person, place, and time. She appears well-developed.  HENT:  Head: Normocephalic and atraumatic.  Eyes: Pupils are equal, round, and reactive to light. EOM are normal.  Neck: Normal range of motion. Neck supple.  Cardiovascular: Normal rate, intact distal pulses and normal pulses.  Pulmonary/Chest: Effort normal. She has no decreased breath sounds.  Abdominal: Bowel sounds are normal.  Musculoskeletal:       Right lower leg: She exhibits no tenderness and no edema.       Left lower leg: She exhibits no tenderness and no edema.  Subtle right lower extremity swelling  Neurological: She is alert and oriented to person, place, and time. She is not disoriented. No cranial nerve deficit.  Cerebellar exam is normal  (finger to nose) Sensory exam normal for bilateral upper and lower extremities - and patient is able to discriminate between sharp and dull. Motor exam is 4+/5   Skin: Skin is warm and dry.  Nursing note and vitals reviewed.    ED Treatments / Results  Labs (all labs ordered are listed, but only abnormal results are displayed) Labs Reviewed  CBC - Abnormal; Notable for the following components:      Result Value   Hemoglobin 11.5 (*)    HCT 34.7 (*)    All other components within normal limits  HEPATIC FUNCTION PANEL - Abnormal; Notable for the following components:   Total Protein 6.3 (*)    Albumin 3.0 (*)    AST 46 (*)    Alkaline Phosphatase 188 (*)    All other components within normal limits  URINALYSIS, ROUTINE W REFLEX MICROSCOPIC - Abnormal; Notable for the following components:   Specific Gravity, Urine 1.004 (*)    Hgb urine dipstick LARGE (*)    Leukocytes, UA MODERATE (*)    RBC / HPF >50 (*)    Bacteria, UA RARE (*)    All other components within normal limits  BRAIN NATRIURETIC PEPTIDE - Abnormal; Notable for the following components:   B Natriuretic Peptide 393.8 (*)    All other components within normal limits  D-DIMER, QUANTITATIVE (NOT AT [facility]) - Abnormal; Notable for the following components:   D-Dimer, Quant 2.00 (*)    All other components within normal limits  PROTEIN / CREATININE RATIO, URINE - Abnormal; Notable for the following components:   Protein Creatinine Ratio 1.38 (*)    All other components within normal limits  BASIC METABOLIC PANEL  APTT  PROTIME-INR  I-STAT TROPONIN, ED  I-STAT TROPONIN, ED    EKG EKG Interpretation  Date/Time:  Monday February 03 2018 06:34:19 EDT Ventricular Rate:  63 PR Interval:    QRS Duration: 87 QT Interval:  404 QTC Calculation: 414 R Axis:   29 Text Interpretation:  Sinus rhythm Probable left atrial enlargement ST elev, probable normal early repol pattern No previous ECGs available Reconfirmed by  Varney Biles [phone]) on 02/03/2018 7:24:18 AM Also confirmed by Varney Biles [phone]), editor Lynder Parents [phone])  on 02/03/2018 9:09:42 AM   Radiology Dg Chest 2 View  Result Date: 02/03/2018 CLINICAL DATA:  Initial evaluation for acute chest pain. EXAM: CHEST - 2 VIEW COMPARISON:  None. FINDINGS: The cardiac and mediastinal silhouettes are within normal limits. The lungs are normally inflated. No airspace consolidation, pleural effusion, or pulmonary edema is identified. There is no pneumothorax. No acute osseous abnormality identified. IMPRESSION: No  active cardiopulmonary disease. Electronically Signed   By: Jeannine Boga M.D.   On: 02/03/2018 06:59   Ct Angio Chest Pe W And/or Wo Contrast  Result Date: 02/03/2018 CLINICAL DATA:  Intermediate probability for pulmonary embolism. Chest pain starting 2 hours ago. Positive D-dimer. Recent delivery EXAM: CT ANGIOGRAPHY CHEST WITH CONTRAST TECHNIQUE: Multidetector CT imaging of the chest was performed using the standard protocol during bolus administration of intravenous contrast. Multiplanar CT image reconstructions and MIPs were obtained to evaluate the vascular anatomy. CONTRAST:  79m ISOVUE-370 IOPAMIDOL (ISOVUE-370) INJECTION 76% COMPARISON:  None. FINDINGS: Cardiovascular: There is a calcified nodular density directly central to a left lower lobe subsegmental vessel at the level of the lateral basal segment with a somewhat branching appearance. No other pulmonary artery filling defect is seen. Normal heart size. No pericardial effusion. Mediastinum/Nodes: Negative for adenopathy or mass. Prominent thymus, likely physiologic. Lungs/Pleura: The central airways are clear. Small sub pleural ground-glass density immediately associated with the abnormal vessels described above. Elsewhere reticulation and interlobular septal thickening in the left lower lobe is attributed atelectasis given volume loss. Upper Abdomen: Negative Musculoskeletal:  No acute or aggressive finding These results were called by telephone at the time of interpretation on 02/03/2018 at 11:34 am to Dr. AVarney Biles, who verbally acknowledged these results. No history of vertebroplasty. Review of the MIP images confirms the above findings. IMPRESSION: 1. Subsegmental calcified pulmonary embolism in the left lower lobe. Calcification favors a chronic process but associated subpleural ground-glass density and history is more indicative of an acute embolism. Suggest venous imaging to exclude a calcified thrombus. 2. Mild left lower lobe atelectasis superimposed on the above. Electronically Signed   By: JMonte FantasiaM.D.   On: 02/03/2018 11:36   UKoreaAbdomen Limited Ruq  Result Date: 02/03/2018 CLINICAL DATA:  Abdominal pain. EXAM: ULTRASOUND ABDOMEN LIMITED RIGHT UPPER QUADRANT COMPARISON:  None. FINDINGS: Gallbladder: No gallstones or wall thickening visualized. No sonographic Murphy sign noted by sonographer. Common bile duct: Diameter: 1.7 mm Liver: No focal lesion identified. Within normal limits in parenchymal echogenicity. Portal vein is patent on color Doppler imaging with normal direction of blood flow towards the liver. IMPRESSION: Normal right upper quadrant ultrasound. Electronically Signed   By: HKathreen Devoid  On: 02/03/2018 08:56    Procedures Procedures (including critical care time)  CRITICAL CARE Performed by: Eniya Cannady   Total critical care time: 95 minutes  Critical care time was exclusive of separately billable procedures and treating other patients.  Critical care was necessary to treat or prevent imminent or life-threatening deterioration.  Critical care was time spent personally by me on the following activities: development of treatment plan with patient and/or surrogate as well as nursing, discussions with consultants, evaluation of patient's response to treatment, examination of patient, obtaining history from patient or surrogate,  ordering and performing treatments and interventions, ordering and review of laboratory studies, ordering and review of radiographic studies, pulse oximetry and re-evaluation of patient's condition.   Medications Ordered in ED Medications  iopamidol (ISOVUE-370) 76 % injection (has no administration in time range)  acetaminophen (TYLENOL) tablet 650 mg (650 mg Oral Given 02/03/18 0820)  NIFEdipine (PROCARDIA-XL/ADALAT-CC/NIFEDICAL-XL) 24 hr tablet 30 mg (30 mg Oral Given 02/03/18 1124)  iopamidol (ISOVUE-370) 76 % injection 100 mL (81 mLs Intravenous Contrast Given 02/03/18 1056)     Initial Impression / Assessment and Plan / ED Course  I have reviewed the triage vital signs and the nursing notes.  Pertinent labs &  imaging results that were available during my care of the patient were reviewed by me and considered in my medical decision making (see chart for details).  Clinical Course as of Feb 04 1319  Mon Feb 03, 2018  0904 Headache is resolved after Tylenol.   [AN]  0904 AST is slightly elevated, alk phos is at 180.  Patient had a negative right upper quadrant ultrasound. At this time consult will be made for [facility] [place] [facility]  Alkaline Phosphatase(!): 188 [AN]  0910 Dimer is elevated, predictably because patient just delivered a baby.  CT PE ordered  -as we do not have a good examination for the chest pain radiating to the back.  D-Dimer, Quant(!): 2.00 [AN]  1053 I spoke with Cyndi Bender, [place] [facility]. We discussed patient's elevated blood pressure with chest discomfort and slightly abnormal LFTs.  We also discussed the patient's heart rate being in the low 50s-60s with her elevated blood pressure and suspicion for early preeclampsia.  Ms. page staff the patient with Dr. Charlesetta Garibaldi.  They agree with the plan to get the CT PE completed.  [facility].  They recommend that we give Procardia 30 mg extended release at this time.    [AN]  1054 Results from the ER workup discussed with the patient face to face and all questions answered to the best of my ability.    [AN]  1318 Results from the ER workup discussed with the patient face to face and all questions answered to the best of my ability.  CT PE does show a left-sided PE. Admitting team has been notified. Heparin will be started.  Ultrasound vascular to be completed before patient is transferred.  CT Angio Chest PE W and/or Wo Contrast [AN]    Clinical Course User Index [AN] Varney Biles, MD   22 year old female comes in with chief complaint of chest pain that is radiating to her back.  She also has positional component to the pain, it is worse when she is laying flat.  Pain does not appear to be pleuritic in nature, but patient does have associated dizziness and a headache.  Additionally, patient is noted to be hypertensive.  Patient's abdominal exam is reassuring, neuro exam is within normal limits.  Differential diagnosis includes preeclampsia -early onset within the spectrum of the disease. Additionally, cholelithiasis can present with atypical back pain, therefore that is in the differential diagnosis along with PE, pericarditis, postpartum myopericarditis.  Patient does not have any dissection risk factor -there is no family history of premature CAD, or musculoskeletal disorders either..  Final Clinical Impressions(s) / ED Diagnoses   Final diagnoses:  Abdominal pain  Acute pulmonary embolism without acute cor pulmonale, unspecified pulmonary embolism type (HCC)  Preeclampsia in postpartum period    ED Discharge Orders    None       Varney Biles, MD 02/03/18 [place], Raivyn Kabler, MD 02/03/18 1320

## 2018-02-03 NOTE — H&P (Signed)
Majorie Wingate Luretha RuedFaison is an 22 y.o. female. Admitted for PE diagnosed by spiral CT.  No headache, RUQ pain or visual changes.  Pt denies andy family history of VTE, she is not a smoker and never used OCPS.  She had SVD 8/15  Pertinent Gynecological History: Menses: flow is moderate Bleeding: na Contraception: none DES exposure: denies Blood transfusions: none Sexually transmitted diseases: no past history Previous GYN Procedures: na  Last mammogram: na Date:  Last pap: normal Date: 2019 OB History: G1, P1   Menstrual History: Menarche age: [age] No LMP recorded.    Past Medical History:  Diagnosis Date  . Anemia   . Primary thrombocytopenia (HCC)     Past Surgical History:  Procedure Laterality Date  . WISDOM TOOTH EXTRACTION      Family History  Problem Relation Age of Onset  . Diabetes Maternal Grandmother   . Hypertension Maternal Grandfather     Social History:  reports that she has never smoked. She has never used smokeless tobacco. She reports that she does not drink alcohol or use drugs.  Allergies: No Known Allergies  Medications Prior to Admission  Medication Sig Dispense Refill Last Dose  . ferrous sulfate 325 (65 FE) MG tablet Take 1 tablet (325 mg total) by mouth daily with breakfast. 90 tablet 0 02/02/2018 at Unknown time  . ibuprofen (ADVIL,MOTRIN) 600 MG tablet Take 1 tablet (600 mg total) by mouth every 6 (six) hours as needed for mild pain or moderate pain. 30 tablet 0 02/02/2018 at Unknown time  . Prenatal Vit-Fe Fumarate-FA (PRENATAL MULTIVITAMIN) TABS tablet Take 1 tablet by mouth daily at 12 noon.   02/02/2018 at Unknown time    ROS  Blood pressure (!) 130/94, pulse 64, temperature 98.6 F (37 C), temperature source Oral, resp. rate 18, height 5\' 3"  (1.6 m), weight 66.7 kg, SpO2 100 %, currently breastfeeding. Physical Exam  Physical Examination: General appearance - alert, well appearing, and in no distress Mental status - alert, oriented to  person, place, and time Chest - clear to auscultation, no wheezes, rales or rhonchi, symmetric air entry Heart - normal rate and regular rhythm Abdomen - soft, nontender, nondistended, no masses or organomegaly Fundus firm and NT Neurological - alert, oriented, normal speech, no focal findings or movement disorder noted Extremities - pedal edema 1 +, Homan's sign negative bilaterally 2 plus DTR B  Results for orders placed or performed during the hospital encounter of 02/03/18 (from the past 24 hour(s))  Basic metabolic panel     Status: None   Collection Time: 02/03/18  6:30 AM  Result Value Ref Range   Sodium 140 135 - 145 mmol/L   Potassium 3.9 3.5 - 5.1 mmol/L   Chloride 106 98 - 111 mmol/L   CO2 25 22 - 32 mmol/L   Glucose, Bld 80 70 - 99 mg/dL   BUN 11 6 - 20 mg/dL   Creatinine, Ser 1.610.75 0.44 - 1.00 mg/dL   Calcium 9.3 8.9 - 09.610.3 mg/dL   GFR calc non Af Amer >60 >60 mL/min   GFR calc Af Amer >60 >60 mL/min   Anion gap 9 5 - 15  CBC     Status: Abnormal   Collection Time: 02/03/18  6:30 AM  Result Value Ref Range   WBC 7.4 4.0 - 10.5 K/uL   RBC 4.09 3.87 - 5.11 MIL/uL   Hemoglobin 11.5 (L) 12.0 - 15.0 g/dL   HCT 04.534.7 (L) 40.936.0 - 81.146.0 %  MCV 84.8 78.0 - 100.0 fL   MCH 28.1 26.0 - 34.0 pg   MCHC 33.1 30.0 - 36.0 g/dL   RDW 09.813.7 11.911.5 - 14.715.5 %   Platelets 188 150 - 400 K/uL  Hepatic function panel     Status: Abnormal   Collection Time: 02/03/18  6:30 AM  Result Value Ref Range   Total Protein 6.3 (L) 6.5 - 8.1 g/dL   Albumin 3.0 (L) 3.5 - 5.0 g/dL   AST 46 (H) 15 - 41 U/L   ALT 33 0 - 44 U/L   Alkaline Phosphatase 188 (H) 38 - 126 U/L   Total Bilirubin 0.7 0.3 - 1.2 mg/dL   Bilirubin, Direct 0.2 0.0 - 0.2 mg/dL   Indirect Bilirubin 0.5 0.3 - 0.9 mg/dL  APTT     Status: None   Collection Time: 02/03/18  6:30 AM  Result Value Ref Range   aPTT 28 24 - 36 seconds  Protime-INR     Status: None   Collection Time: 02/03/18  6:30 AM  Result Value Ref Range   Prothrombin  Time 12.1 11.4 - 15.2 seconds   INR 0.90   Brain natriuretic peptide     Status: Abnormal   Collection Time: 02/03/18  6:30 AM  Result Value Ref Range   B Natriuretic Peptide 393.8 (H) 0.0 - 100.0 pg/mL  D-dimer, quantitative (not [facility])     Status: Abnormal   Collection Time: 02/03/18  6:30 AM  Result Value Ref Range   D-Dimer, Quant 2.00 (H) 0.00 - 0.50 ug/mL-FEU  I-stat troponin, ED     Status: None   Collection Time: 02/03/18  7:23 AM  Result Value Ref Range   Troponin i, poc 0.02 0.00 - 0.08 ng/mL   Comment 3          Urinalysis, Routine w reflex microscopic     Status: Abnormal   Collection Time: 02/03/18  7:23 AM  Result Value Ref Range   Color, Urine YELLOW YELLOW   APPearance CLEAR CLEAR   Specific Gravity, Urine 1.004 (L) 1.005 - 1.030   pH 8.0 5.0 - 8.0   Glucose, UA NEGATIVE NEGATIVE mg/dL   Hgb urine dipstick LARGE (A) NEGATIVE   Bilirubin Urine NEGATIVE NEGATIVE   Ketones, ur NEGATIVE NEGATIVE mg/dL   Protein, ur NEGATIVE NEGATIVE mg/dL   Nitrite NEGATIVE NEGATIVE   Leukocytes, UA MODERATE (A) NEGATIVE   RBC / HPF >50 (H) 0 - 5 RBC/hpf   WBC, UA 21-50 0 - 5 WBC/hpf   Bacteria, UA RARE (A) NONE SEEN   Squamous Epithelial / LPF 0-5 0 - 5  Protein / creatinine ratio, urine     Status: Abnormal   Collection Time: 02/03/18  7:23 AM  Result Value Ref Range   Creatinine, Urine 14.48 mg/dL   Total Protein, Urine 20 mg/dL   Protein Creatinine Ratio 1.38 (H) 0.00 - 0.15 mg/mg[Cre]    Dg Chest 2 View  Result Date: 02/03/2018 CLINICAL DATA:  Initial evaluation for acute chest pain. EXAM: CHEST - 2 VIEW COMPARISON:  None. FINDINGS: The cardiac and mediastinal silhouettes are within normal limits. The lungs are normally inflated. No airspace consolidation, pleural effusion, or pulmonary edema is identified. There is no pneumothorax. No acute osseous abnormality identified. IMPRESSION: No active cardiopulmonary disease. Electronically Signed   By: Rise MuBenjamin  McClintock M.D.    On: 02/03/2018 06:59   Ct Angio Chest Pe W And/or Wo Contrast  Result Date: 02/03/2018 CLINICAL DATA:  Intermediate  probability for pulmonary embolism. Chest pain starting 2 hours ago. Positive D-dimer. Recent delivery EXAM: CT ANGIOGRAPHY CHEST WITH CONTRAST TECHNIQUE: Multidetector CT imaging of the chest was performed using the standard protocol during bolus administration of intravenous contrast. Multiplanar CT image reconstructions and MIPs were obtained to evaluate the vascular anatomy. CONTRAST:  81mL ISOVUE-370 IOPAMIDOL (ISOVUE-370) INJECTION 76% COMPARISON:  None. FINDINGS: Cardiovascular: There is a calcified nodular density directly central to a left lower lobe subsegmental vessel at the level of the lateral basal segment with a somewhat branching appearance. No other pulmonary artery filling defect is seen. Normal heart size. No pericardial effusion. Mediastinum/Nodes: Negative for adenopathy or mass. Prominent thymus, likely physiologic. Lungs/Pleura: The central airways are clear. Small sub pleural ground-glass density immediately associated with the abnormal vessels described above. Elsewhere reticulation and interlobular septal thickening in the left lower lobe is attributed atelectasis given volume loss. Upper Abdomen: Negative Musculoskeletal: No acute or aggressive finding These results were called by telephone at the time of interpretation on 02/03/2018 at 11:34 am to Dr. Derwood Kaplan , who verbally acknowledged these results. No history of vertebroplasty. Review of the MIP images confirms the above findings. IMPRESSION: 1. Subsegmental calcified pulmonary embolism in the left lower lobe. Calcification favors a chronic process but associated subpleural ground-glass density and history is more indicative of an acute embolism. Suggest venous imaging to exclude a calcified thrombus. 2. Mild left lower lobe atelectasis superimposed on the above. Electronically Signed   By: Marnee Spring  M.D.   On: 02/03/2018 11:36   US Abdomen Limited Ruq  Result Date: 02/03/2018 CLINICAL DATA:  Abdominal pain. EXAM: ULTRASOUND ABDOMEN LIMITED RIGHT UPPER QUADRANT COMPARISON:  None. FINDINGS: Gallbladder: No gallstones or wall thickening visualized. No sonographic Murphy sign noted by sonographer. Common bile duct: Diameter: 1.7 mm Liver: No focal lesion identified. Within normal limits in parenchymal echogenicity. Portal vein is patent on color Doppler imaging with normal direction of blood flow towards the liver. IMPRESSION: Normal right upper quadrant ultrasound. Electronically Signed   By: Elige Ko   On: 02/03/2018 08:56    Assessment/Plan: PP PE Hematology consult Keep heparin drip for 24 hours then start on lovenox  Preeclampsia with mild features.  Start on procardia and draw labs in the am.  Will hold on magnesium for now  Pt with some chest pressure.  Pulse ox normal will do pain management.  Follow closely.    Did speak with MFM briefly who agrees with the plan  Chukwudi Ewen A 02/03/2018, 4:11 PM

## 2018-02-03 NOTE — Lactation Note (Signed)
[facility]  Called to [facility] to speak with mother about breast milk volumes.  Mother is a readmit and is concerned that she does not have enough breastmilk for baby.  Mother is pumping approximately 3 oz from each breast every time she pumps.  I reinforced that she is already pumping large volumes for day of life 4.  She has been using her EBM to bottle feed baby due to sore nipples.  Her nipples are intact with slight irritation but nothing significant.  I suggested using EBM to rub into nipple/areola after feeding and/or pumping.  I also provided comfort gels with instructions for use.  Mother desires to put baby back to breast.  I reinforced the importance of stopping artificial nipples and starting to put baby back to breast at the next feeding with the first sign of feeding cues.  Mother verbalized understanding.  She stated that he is too fussy to latch.  I reminded her to watch carefully for the feeding cues and to latch before the fussiness begins.  I suggested if she cannot get baby to be calm for latching she may give a couple mls of EBM via syringe to "settle" him down and then latch.  Do as much breastfeeding as possible.  Continue feeding 8-12 times/24 hours or sooner if he shows cues.  She will use the comfort gels in between feedings throughout the night.  Encouraged her to call for latch assistance as needed.  Family present.  RN updated.

## 2018-02-03 NOTE — Consult Note (Signed)
[place]  Telephone:([phone]) [phone] Fax:(336) [phone]     ID: Diana Lowe DOB: 1996/05/01  MR#: [id number]  TDH#:[id number]  Patient Care Team: Patient, No Pcp Per as PCP - General (General Practice) Crawford Givens, MD as Consulting Physician (Obstetrics and Gynecology) Chauncey Cruel, MD OTHER MD:  CHIEF COMPLAINT: Pulmonary embolism  CURRENT TREATMENT: Intravenous heparin   HISTORY OF CURRENT ILLNESS: Diana Lowe is a 22 year old [place] woman currently day 5 from uncomplicated delivery of a baby boy 01/30/2018.  This morning, 02/03/2018, around 5 AM, she woke up with back pain.  This changed to a chest pressure sensation, not associated with shortness of breath, cough, or pleurisy.  This was not relieved by change in position or by trying reflux medications as was suggested by primary care.  Accordingly she presented to the emergency room approximately 6:30 in the morning where she was found to be hypertensive and to have some swelling of the left lower extremity.  Bilateral Doppler ultrasonography showed no evidence of DVT.  However a CT angiogram of the chest showed a calcified clot in the left lower lobe with associated subpleural changes.  The patient was started on intravenous heparin and we were consulted to evaluate for possible hypercoagulable state.  INTERVAL HISTORY: I met with the patient in her room at [facility] 02/03/2018.  At the end of the visit the patient and significant other and their baby arrived  REVIEW OF SYSTEMS: Currently the chest pressure sensation has completely resolved.  She had a mild headache this morning which also has resolved and there have been no worrisome or persistent headaches.  She denies cough, fever, or hemoptysis.  There has been no change in bowel or bladder habits.  She generally exercises at [employer] but in the last few months she has not been that active physically.  A detailed review of  systems today was otherwise noncontributory  PAST MEDICAL HISTORY: Past Medical History:  Diagnosis Date  . Anemia   . Primary thrombocytopenia (HCC)   Left lower extremity pulmonary embolism  PAST SURGICAL HISTORY: Past Surgical History:  Procedure Laterality Date  . WISDOM TOOTH EXTRACTION      FAMILY HISTORY Family History  Problem Relation Age of Onset  . Diabetes Maternal Grandmother   . Hypertension Maternal Grandfather   As of August 2019 the patient's father is 76 years old.  The patient's mother is 58 years old.  He lives in [place].  She lives in [place].  The patient has 1 full brothers, 4/2 brothers and 2 half sisters.  She is not aware of any history of clotting or bleeding disorders in the family including no history of strokes or heart attacks.  GYNECOLOGIC HISTORY:  Her periods have always been very irregular although in the last year they came around once a month, between 3 and 5 weeks apart.   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  After her boyfriend moved to this area she came to [place].  She is not currently working.  His name is  Psychologist, prison and probation services and he works in accounts payable locally.  At home it just the 2 of them and their baby Diana Lowe.  The patient's mother in law Diana Lowe  lives nearby  ADVANCED DIRECTIVES: Not in place   HEALTH MAINTENANCE: Social History   Tobacco Use  . Smoking status: Never Smoker  . Smokeless tobacco: Never Used  Substance Use Topics  . Alcohol use: Never    Frequency: Never  . Drug use: Never       No Known Allergies  Current Facility-Administered Medications  Medication Dose Route Frequency Provider Last Rate Last Dose  . sodium chloride flush (NS) 0.9 % injection 3 mL  3 mL  Intravenous Q12H Dillard, Naima, MD       And  . sodium chloride flush (NS) 0.9 % injection 3 mL  3 mL Intravenous PRN Dillard, Naima, MD       And  . 0.9 %  sodium chloride infusion  250 mL Intravenous PRN Dillard, Naima, MD      . acetaminophen (TYLENOL) tablet 650 mg  650 mg Oral Q4H PRN Dillard, Naima, MD      . benzocaine-Menthol (DERMOPLAST) 20-0.5 % topical spray 1 application  1 application Topical PRN Dillard, Naima, MD      . coconut oil  1 application Topical PRN Dillard, Naima, MD      . witch hazel-glycerin (TUCKS) pad 1 application  1 application Topical PRN Dillard, Naima, MD       And  . dibucaine (NUPERCAINAL) 1 % rectal ointment 1 application  1 application Rectal PRN Dillard, Naima, MD      . diphenhydrAMINE (BENADRYL) capsule 25 mg  25 mg Oral Q6H PRN Dillard, Naima, MD      . heparin ADULT infusion 100 units/mL (25000 units/233m sodium chloride 0.45%)  1,100 Units/hr Intravenous Continuous Dillard, Naima, MD 11 mL/hr at 02/03/18 1719 1,100 Units/hr at 02/03/18 1719  . iopamidol (ISOVUE-370) 76 % injection           . ondansetron (ZOFRAN) tablet 4 mg  4 mg Oral Q4H PRN Dillard, Naima, MD       Or  . ondansetron (ZOFRAN) injection 4 mg  4 mg Intravenous Q4H PRN Dillard, Naima, MD      . oxyCODONE-acetaminophen (PERCOCET/ROXICET) 5-325 MG per tablet 1 tablet  1 tablet Oral Q4H PRN Dillard, Naima, MD      . oxyCODONE-acetaminophen (PERCOCET/ROXICET) 5-325 MG per tablet 2 tablet  2 tablet Oral Q4H PRN DCrawford Givens MD      . [Derrill MemoON 02/04/2018] prenatal multivitamin tablet 1 tablet  1 tablet Oral Q1200 Dillard, Naima, MD      . [Derrill MemoON 02/04/2018] senna-docusate (Senokot-S) tablet 2 tablet  2 tablet Oral Q24H Dillard, Naima, MD      . simethicone (MYLICON) chewable tablet 80 mg  80 mg Oral PRN Dillard, Naima, MD      . zolpidem (AMBIEN) tablet 5 mg  5 mg Oral QHS PRN DCrawford Givens MD        OBJECTIVE: Young African-American woman examined at bedside  Vitals:    02/03/18 1427 02/03/18 1550  BP:  (!) 130/94  Pulse: 60 64  Resp: 13 18  Temp:  98.6 F (37 C)  SpO2: 99% 100%     Body mass index is 26.04 kg/m.   Wt Readings from Last 3 Encounters:  02/03/18 147 lb (66.7 kg)  01/30/18 160 lb (72.6 kg)  10/03/17 135 lb (61.2 kg)      ECOG FS:1 - Symptomatic but completely ambulatory  Ocular: Sclerae unicteric, EOMs intact Lymphatic: No cervical or supraclavicular adenopathy Lungs no rales or rhonchi, no wheezes Heart regular rate and rhythm Abd  soft, nontender, positive bowel sounds MSK 1+ left lower extremity edema without erythema Neuro: non-focal, well-oriented, appropriate affect Breasts: Deferred   LAB RESULTS:  CMP     Component Value Date/Time   NA 140 02/03/2018 0630   K 3.9 02/03/2018 0630   CL 106 02/03/2018 0630   CO2 25 02/03/2018 0630   GLUCOSE 80 02/03/2018 0630   BUN 11 02/03/2018 0630   CREATININE 0.75 02/03/2018 0630   CALCIUM 9.3 02/03/2018 0630   PROT 6.3 (L) 02/03/2018 0630   ALBUMIN 3.0 (L) 02/03/2018 0630   AST 46 (H) 02/03/2018 0630   ALT 33 02/03/2018 0630   ALKPHOS 188 (H) 02/03/2018 0630   BILITOT 0.7 02/03/2018 0630   GFRNONAA >60 02/03/2018 0630   GFRAA >60 02/03/2018 0630    No results found for: TOTALPROTELP, ALBUMINELP, A1GS, A2GS, BETS, BETA2SER, GAMS, MSPIKE, SPEI  No results found for: KPAFRELGTCHN, LAMBDASER, KAPLAMBRATIO  Lab Results  Component Value Date   WBC 7.4 02/03/2018   HGB 11.5 (L) 02/03/2018   HCT 34.7 (L) 02/03/2018   MCV 84.8 02/03/2018   PLT 188 02/03/2018    _0 @  No results found for: LABCA2  No components found for: QXIHWT888  Recent Labs  Lab 02/03/18 0630  INR 0.90    No results found for: LABCA2  No results found for: KCM034  No results found for: JZP915  No results found for: AVW979  No results found for: CA2729  No components found for: HGQUANT  No results found for: CEA1 / No results found for: CEA1   No results found for:  AFPTUMOR  No results found for: CHROMOGRNA  No results found for: PSA1  Admission on 02/03/2018  Component Date Value Ref Range Status  . Sodium 02/03/2018 140  135 - 145 mmol/L Final  . Potassium 02/03/2018 3.9  3.5 - 5.1 mmol/L Final  . Chloride 02/03/2018 106  98 - 111 mmol/L Final  . CO2 02/03/2018 25  22 - 32 mmol/L Final  . Glucose, Bld 02/03/2018 80  70 - 99 mg/dL Final  . BUN 02/03/2018 11  6 - 20 mg/dL Final  . Creatinine, Ser 02/03/2018 0.75  0.44 - 1.00 mg/dL Final  . Calcium 02/03/2018 9.3  8.9 - 10.3 mg/dL Final  . GFR calc non Af Amer 02/03/2018 >60  >60 mL/min Final  . GFR calc Af Amer 02/03/2018 >60  >60 mL/min Final   Comment: (NOTE) The eGFR has been calculated using the CKD EPI equation. This calculation has not been validated in all clinical situations. eGFR's persistently <60 mL/min signify possible Chronic Kidney Disease.   Georgiann Hahn gap 02/03/2018 9  5 - 15 Final   Performed at [facility], [place] [address] [place], [place] [address]  . WBC 02/03/2018 7.4  4.0 - 10.5 K/uL Final  . RBC 02/03/2018 4.09  3.87 - 5.11 MIL/uL Final  . Hemoglobin 02/03/2018 11.5* 12.0 - 15.0 g/dL Final  . HCT 02/03/2018 34.7* 36.0 - 46.0 % Final  . MCV 02/03/2018 84.8  78.0 - 100.0 fL Final  . MCH 02/03/2018 28.1  26.0 - 34.0 pg Final  . MCHC 02/03/2018 33.1  30.0 - 36.0 g/dL Final  . RDW 02/03/2018 13.7  11.5 - 15.5 % Final  . Platelets 02/03/2018 188  150 - 400 K/uL Final   Performed at [facility], [place] [address], [place], [place] [address]  . Troponin i, poc 02/03/2018 0.02  0.00 - 0.08 ng/mL Final  .  Comment 3 02/03/2018          Final   Comment: Due to the release kinetics of cTnI, a negative result within the first hours of the onset of symptoms does not rule out myocardial infarction with certainty. If myocardial infarction is still suspected, repeat the test at appropriate intervals.   . Total Protein 02/03/2018 6.3* 6.5 - 8.1  g/dL Final  . Albumin 02/03/2018 3.0* 3.5 - 5.0 g/dL Final  . AST 02/03/2018 46* 15 - 41 U/L Final  . ALT 02/03/2018 33  0 - 44 U/L Final  . Alkaline Phosphatase 02/03/2018 188* 38 - 126 U/L Final  . Total Bilirubin 02/03/2018 0.7  0.3 - 1.2 mg/dL Final  . Bilirubin, Direct 02/03/2018 0.2  0.0 - 0.2 mg/dL Final  . Indirect Bilirubin 02/03/2018 0.5  0.3 - 0.9 mg/dL Final   Performed at [facility] [place] [address], [place], [place] [address]  . Color, Urine 02/03/2018 YELLOW  YELLOW Final  . APPearance 02/03/2018 CLEAR  CLEAR Final  . Specific Gravity, Urine 02/03/2018 1.004* 1.005 - 1.030 Final  . pH 02/03/2018 8.0  5.0 - 8.0 Final  . Glucose, UA 02/03/2018 NEGATIVE  NEGATIVE mg/dL Final  . Hgb urine dipstick 02/03/2018 LARGE* NEGATIVE Final  . Bilirubin Urine 02/03/2018 NEGATIVE  NEGATIVE Final  . Ketones, ur 02/03/2018 NEGATIVE  NEGATIVE mg/dL Final  . Protein, ur 02/03/2018 NEGATIVE  NEGATIVE mg/dL Final  . Nitrite 02/03/2018 NEGATIVE  NEGATIVE Final  . Leukocytes, UA 02/03/2018 MODERATE* NEGATIVE Final  . RBC / HPF 02/03/2018 >50* 0 - 5 RBC/hpf Final  . WBC, UA 02/03/2018 21-50  0 - 5 WBC/hpf Final  . Bacteria, UA 02/03/2018 RARE* NONE SEEN Final  . Squamous Epithelial / LPF 02/03/2018 0-5  0 - 5 Final   Performed at [facility], [place] [address] [place], [place] [address]  . aPTT 02/03/2018 28  24 - 36 seconds Final   Performed at [facility], [place] [address], [place], [place] [address]  . Prothrombin Time 02/03/2018 12.1  11.4 - 15.2 seconds Final  . INR 02/03/2018 0.90   Final   Performed at [facility], [place] [address] [place], [place] [address]  . B Natriuretic Peptide 02/03/2018 393.8* 0.0 - 100.0 pg/mL Final   Performed at [place] [address], [place], [place] [address]  . D-Dimer, Quant 02/03/2018 2.00* 0.00 - 0.50 ug/mL-FEU Final   Comment: (NOTE) At the manufacturer  cut-off of 0.50 ug/mL FEU, this assay has been documented to exclude PE with a sensitivity and negative predictive value of 97 to 99%.  At this time, this assay has not been approved by the FDA to exclude DVT/VTE. Results should be correlated with clinical presentation. Performed at [facility], [place] [address] [place], [place] [address]   . Creatinine, Urine 02/03/2018 14.48  mg/dL Final  . Total Protein, Urine 02/03/2018 20  mg/dL Final   NO NORMAL RANGE ESTABLISHED FOR THIS TEST  . Protein Creatinine Ratio 02/03/2018 1.38* 0.00 - 0.15 mg/mg[Cre] Final   Performed at [place] [address], [place], [place] [address]  Admission on 01/30/2018, Discharged on 02/01/2018  Component Date Value Ref Range Status  . WBC 01/30/2018 10.7* 4.0 - 10.5 K/uL Final  . RBC 01/30/2018 3.79* 3.87 - 5.11 MIL/uL Final  . Hemoglobin 01/30/2018 10.9* 12.0 - 15.0 g/dL Final  . HCT 01/30/2018 32.6* 36.0 - 46.0 %  Final  . MCV 01/30/2018 86.0  78.0 - 100.0 fL Final  . MCH 01/30/2018 28.8  26.0 - 34.0 pg Final  . MCHC 01/30/2018 33.4  30.0 - 36.0 g/dL Final  . RDW 01/30/2018 13.8  11.5 - 15.5 % Final  . Platelets 01/30/2018 151  150 - 400 K/uL Final   Performed at [facility], [address], [place], [place] [address]  . ABO/RH(D) 01/30/2018 O POS   Final  . Antibody Screen 01/30/2018 NEG   Final  . Sample Expiration 01/30/2018    Final                   Value:02/02/2018 Performed at [facility], [address], [place], [place] [address]   . RPR Ser Ql 01/30/2018 Non Reactive  Non Reactive Final   Comment: (NOTE) Performed At: [facility] [address] [place], [place] [id number] Rush Farmer MD WL:[id number]   . ABO/RH(D) 01/30/2018    Final                   Value:O POS Performed at [facility], [address] [place], [place] [address]   . WBC 01/31/2018 15.3* 4.0 - 10.5 K/uL Final  . RBC 01/31/2018 3.32* 3.87 - 5.11  MIL/uL Final  . Hemoglobin 01/31/2018 9.8* 12.0 - 15.0 g/dL Final  . HCT 01/31/2018 28.2* 36.0 - 46.0 % Final  . MCV 01/31/2018 84.9  78.0 - 100.0 fL Final  . MCH 01/31/2018 29.5  26.0 - 34.0 pg Final  . MCHC 01/31/2018 34.8  30.0 - 36.0 g/dL Final  . RDW 01/31/2018 13.6  11.5 - 15.5 % Final  . Platelets 01/31/2018 143* 150 - 400 K/uL Final   Performed at [facility], [address], [place], [place] [address]    (this displays the last labs from the last 3 days)  No results found for: TOTALPROTELP, ALBUMINELP, A1GS, A2GS, BETS, BETA2SER, GAMS, MSPIKE, SPEI (this displays SPEP labs)  No results found for: KPAFRELGTCHN, LAMBDASER, KAPLAMBRATIO (kappa/lambda light chains)  No results found for: HGBA, HGBA2QUANT, HGBFQUANT, HGBSQUAN (Hemoglobinopathy evaluation)   No results found for: LDH  No results found for: IRON, TIBC, IRONPCTSAT (Iron and TIBC)  No results found for: FERRITIN  Urinalysis    Component Value Date/Time   COLORURINE YELLOW 02/03/2018 [place]  02/03/2018 0723   LABSPEC 1.004 (L) 02/03/2018 0723   PHURINE 8.0 02/03/2018 0723   GLUCOSEU NEGATIVE 02/03/2018 0723   HGBUR LARGE (A) 02/03/2018 0723   BILIRUBINUR NEGATIVE 02/03/2018 0723   KETONESUR NEGATIVE 02/03/2018 0723   PROTEINUR NEGATIVE 02/03/2018 0723   NITRITE NEGATIVE 02/03/2018 0723   LEUKOCYTESUR MODERATE (A) 02/03/2018 0723     STUDIES: Dg Chest 2 View  Result Date: 02/03/2018 CLINICAL DATA:  Initial evaluation for acute chest pain. EXAM: CHEST - 2 VIEW COMPARISON:  None. FINDINGS: The cardiac and mediastinal silhouettes are within normal limits. The lungs are normally inflated. No airspace consolidation, pleural effusion, or pulmonary edema is identified. There is no pneumothorax. No acute osseous abnormality identified. IMPRESSION: No active cardiopulmonary disease. Electronically Signed   By: Jeannine Boga M.D.   On: 02/03/2018 06:59   Ct Angio Chest Pe W And/or  Wo Contrast  Result Date: 02/03/2018 CLINICAL DATA:  Intermediate probability for pulmonary embolism. Chest pain starting 2 hours ago. Positive D-dimer. Recent delivery EXAM: CT ANGIOGRAPHY CHEST WITH CONTRAST TECHNIQUE: Multidetector CT imaging of the chest was performed using the standard protocol during bolus administration of  intravenous contrast. Multiplanar CT image reconstructions and MIPs were obtained to evaluate the vascular anatomy. CONTRAST:  61m ISOVUE-370 IOPAMIDOL (ISOVUE-370) INJECTION 76% COMPARISON:  None. FINDINGS: Cardiovascular: There is a calcified nodular density directly central to a left lower lobe subsegmental vessel at the level of the lateral basal segment with a somewhat branching appearance. No other pulmonary artery filling defect is seen. Normal heart size. No pericardial effusion. Mediastinum/Nodes: Negative for adenopathy or mass. Prominent thymus, likely physiologic. Lungs/Pleura: The central airways are clear. Small sub pleural ground-glass density immediately associated with the abnormal vessels described above. Elsewhere reticulation and interlobular septal thickening in the left lower lobe is attributed atelectasis given volume loss. Upper Abdomen: Negative Musculoskeletal: No acute or aggressive finding These results were called by telephone at the time of interpretation on 02/03/2018 at 11:34 am to Dr. AVarney Biles, who verbally acknowledged these results. No history of vertebroplasty. Review of the MIP images confirms the above findings. IMPRESSION: 1. Subsegmental calcified pulmonary embolism in the left lower lobe. Calcification favors a chronic process but associated subpleural ground-glass density and history is more indicative of an acute embolism. Suggest venous imaging to exclude a calcified thrombus. 2. Mild left lower lobe atelectasis superimposed on the above. Electronically Signed   By: JMonte FantasiaM.D.   On: 02/03/2018 11:36   UKoreaAbdomen Limited  Ruq  Result Date: 02/03/2018 CLINICAL DATA:  Abdominal pain. EXAM: ULTRASOUND ABDOMEN LIMITED RIGHT UPPER QUADRANT COMPARISON:  None. FINDINGS: Gallbladder: No gallstones or wall thickening visualized. No sonographic Murphy sign noted by sonographer. Common bile duct: Diameter: 1.7 mm Liver: No focal lesion identified. Within normal limits in parenchymal echogenicity. Portal vein is patent on color Doppler imaging with normal direction of blood flow towards the liver. IMPRESSION: Normal right upper quadrant ultrasound. Electronically Signed   By: HKathreen Devoid  On: 02/03/2018 08:56    ELIGIBLE FOR AVAILABLE RESEARCH PROTOCOL: no  ASSESSMENT: 22y.o. GSimsborowoman presenting 02/03/2018 with a left lower extremity pulmonary embolus 5 days postpartum  PLAN: I spent approximately 60 minutes face to face with Diana Lowe with more than 50% of that time spent in counseling and coordination of care. Specifically we reviewed the biology of the patient's diagnosis and the specifics of her situation.  SKarmynunderstands that at childbirth there is a great risk of severe bleeding.  Accordingly a woman's body does all it can to prepare itself for this event.  Many of those body changes make a person's blood clot more readily than usual.  Sometimes the body overdoes it and the result is abnormal clotting such as for example a clot in the leg which may move to the lungs.  Another way of saying this is that pregnancy and delivery puts one at risk of blood clots.  Some people however are at increased risk of abnormal blood clotting in addition to the normal changes of pregnancy.  Some of these people are born with genetic differences that predispose them to clotting.  Others develop changes which may or may not be genetic but which have the same result.  To find out whether we can identify some of these additional clotting abnormalities she will have a hypercoagulable blood panel drawn tomorrow morning.  It will take  some weeks to get those results so she will return to see me in my office after 3 weeks or so to discuss the final results.  In the meantime she is receiving intravenous heparin which is a blood thinner.  She  can be switched to subcutaneous heparin tomorrow morning and if stable can be discharged home.  I would anticipate she will be on heparin a minimum of 3 months at which time we can assess for discontinuation  Narelle has my card and my office will call her with a specific follow-up appointment.  Please let me know if I can be of further assistance at this point.        Ashleen has a good understanding of the overall plan. She agrees with it. She knows the goal of treatment in her case is cure. She will call with any problems that may develop before her next visit here.  Chauncey Cruel, MD   02/03/2018 5:42 PM Medical Oncology and Hematology [facility] [address] [place], [place] [address] Tel. [phone]    Fax. [phone]

## 2018-02-03 NOTE — ED Triage Notes (Signed)
Pt stated "I just gave birth a couple of days ago.  I started having chest pain about 2 hours ago, then my throat felt like it was getting tight in the middle going up around my chin.  Now my head is hurting and I feel dizzy."

## 2018-02-03 NOTE — ED Notes (Signed)
Attempted to call report to [facility]. RN is in a room with a patient and will call back asap.
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
## 2018-02-03 NOTE — ED Notes (Signed)
ED Provider at bedside. 

## 2018-02-03 NOTE — Progress Notes (Signed)
LE venous duplex prelim: negative for DVT. Wright Gravely Eunice, RDMS, RVT  

## 2018-02-04 LAB — COMPREHENSIVE METABOLIC PANEL
ALBUMIN: 3 g/dL — AB (ref 3.5–5.0)
ALK PHOS: 167 U/L — AB (ref 38–126)
ALT: 34 U/L (ref 0–44)
ANION GAP: 11 (ref 5–15)
AST: 45 U/L — ABNORMAL HIGH (ref 15–41)
BILIRUBIN TOTAL: 0.9 mg/dL (ref 0.3–1.2)
BUN: 11 mg/dL (ref 6–20)
CALCIUM: 8.7 mg/dL — AB (ref 8.9–10.3)
CO2: 21 mmol/L — AB (ref 22–32)
CREATININE: 0.77 mg/dL (ref 0.44–1.00)
Chloride: 104 mmol/L (ref 98–111)
GFR calc Af Amer: >60 mL/min (ref >60)
GFR calc non Af Amer: >60 mL/min (ref >60)
GLUCOSE: 106 mg/dL — AB (ref 70–99)
Potassium: 3.3 mmol/L — ABNORMAL LOW (ref 3.5–5.1)
Sodium: 136 mmol/L (ref 135–145)
TOTAL PROTEIN: 6.1 g/dL — AB (ref 6.5–8.1)

## 2018-02-04 LAB — CBC WITH DIFFERENTIAL/PLATELET
Basophils Absolute: 0 10*3/uL (ref 0.0–0.1)
Basophils Relative: 0 %
Eosinophils Absolute: 0.3 10*3/uL (ref 0.0–0.7)
Eosinophils Relative: 3 %
HEMATOCRIT: 36.6 % (ref 36.0–46.0)
HEMOGLOBIN: 12 g/dL (ref 12.0–15.0)
LYMPHS ABS: 3.7 10*3/uL (ref 0.7–4.0)
Lymphocytes Relative: 39 %
MCH: 28.1 pg (ref 26.0–34.0)
MCHC: 32.8 g/dL (ref 30.0–36.0)
MCV: 85.7 fL (ref 78.0–100.0)
MONOS PCT: 5 %
Monocytes Absolute: 0.4 10*3/uL (ref 0.1–1.0)
NEUTROS ABS: 5.2 10*3/uL (ref 1.7–7.7)
NEUTROS PCT: 53 %
Platelets: 232 10*3/uL (ref 150–400)
RBC: 4.27 MIL/uL (ref 3.87–5.11)
RDW: 13.8 % (ref 11.5–15.5)
WBC: 9.6 10*3/uL (ref 4.0–10.5)

## 2018-02-04 LAB — FERRITIN: Ferritin: 21 ng/mL (ref 11–307)

## 2018-02-04 LAB — RETICULOCYTES
RBC.: 4.27 MIL/uL (ref 3.87–5.11)
RETIC COUNT ABSOLUTE: 81.1 10*3/uL (ref 19.0–186.0)
RETIC CT PCT: 1.9 % (ref 0.4–3.1)

## 2018-02-04 LAB — HEPARIN LEVEL (UNFRACTIONATED)
HEPARIN UNFRACTIONATED: 0.3 [IU]/mL (ref 0.30–0.70)
Heparin Unfractionated: <0.1 IU/mL — ABNORMAL LOW (ref 0.30–0.70)

## 2018-02-04 LAB — LACTATE DEHYDROGENASE: LDH: 225 U/L — ABNORMAL HIGH (ref 98–192)

## 2018-02-04 LAB — URIC ACID: Uric Acid, Serum: 6.5 mg/dL (ref 2.5–7.1)

## 2018-02-04 LAB — PROTIME-INR
INR: 0.92
PROTHROMBIN TIME: 12.3 s (ref 11.4–15.2)

## 2018-02-04 LAB — ANTITHROMBIN III: AntiThromb III Func: 89 % (ref 75–120)

## 2018-02-04 MED ORDER — HEPARIN BOLUS VIA INFUSION
2000.0000 [IU] | Freq: Once | INTRAVENOUS | Status: AC
  Administered 2018-02-04: 2000 [IU] via INTRAVENOUS
  Filled 2018-02-04: qty 2000

## 2018-02-04 MED ORDER — ENOXAPARIN SODIUM 100 MG/ML ~~LOC~~ SOLN: 1.5000 mg/kg | SUBCUTANEOUS | 0 refills | Status: DC

## 2018-02-04 MED ORDER — HEPARIN (PORCINE) IN NACL 100-0.45 UNIT/ML-% IJ SOLN
1350.0000 [IU]/h | INTRAMUSCULAR | Status: DC
  Administered 2018-02-04: 1350 [IU]/h via INTRAVENOUS
  Filled 2018-02-04 (×3): qty 250

## 2018-02-04 MED ORDER — ENOXAPARIN (LOVENOX) PATIENT EDUCATION KIT
PACK | Freq: Once | Status: DC
  Filled 2018-02-04: qty 1

## 2018-02-04 MED ORDER — ENOXAPARIN SODIUM 100 MG/ML ~~LOC~~ SOLN
1.5000 mg/kg | SUBCUTANEOUS | Status: DC
  Administered 2018-02-04: 100 mg via SUBCUTANEOUS
  Filled 2018-02-04 (×2): qty 1

## 2018-02-04 NOTE — Care Management Note (Signed)
Case Management Note  Patient Details  Name: Diana Lowe MRN: [id number] Date of Birth: 05/06/1996  Subjective/Objective:       22 year old female admitted yesterday with PP PE.            Action/Plan:D/C when medically  stable.  Additional Comments:CM received consult for discharge planning related to Lovenox.  Pt has Medicaid, therefore is not a candidate for medication assistance/MATCH.  Discharge teaching will be done by bedside RN.  Please re consult if needed.  CM signing off.  Dylynn Ketner RNC-MNN, BSN 02/04/2018, 10:14 AM

## 2018-02-04 NOTE — Progress Notes (Signed)
ANTICOAGULATION CONSULT NOTE - Follow Up Consult  Pharmacy Consult for Heparin Indication: Pulmonary embolus  No Known Allergies  Patient Measurements: Height: 5\' 3"  (160 cm) Weight: 147 lb (66.7 kg) IBW/kg (Calculated) : 52.4  Vital Signs: Temp: 97.8 F (36.6 C) (08/20 0331) Temp Source: Oral (08/20 0331) BP: 129/80 (08/20 0331) Pulse Rate: 56 (08/20 0331)  Labs: Recent Labs    02/03/18 0630 02/04/18 0050 02/04/18 0348 02/04/18 0350  HGB 11.5*  --  12.0  --   HCT 34.7*  --  36.6  --   PLT 188  --  232  --   APTT 28  --   --   --   LABPROT 12.1  --  12.3  --   INR 0.90  --  0.92  --   HEPARINUNFRC  --  <0.10*  --  <0.10*  CREATININE 0.75  --  0.77  --     Estimated Creatinine Clearance: 99.5 mL/min (by C-G formula based on SCr of 0.77 mg/dL).   Medications:  Heparin infusion 8/19>>  Assessment: 22 yo female s/p SVD with left sided PE per spiral CT. IV heparin started at 1100 units/hr after 4000 unit IV bolus with initial heparin level reported as 0.1 unit/ml with result verified with recollection and with IV site patent. Pt with no reported adverse effects from anticoagulation. Per MD note, plan to convert heparin drip to Lovenox after 24 hrs  Goal of Therapy:  Heparin level goal 0.3-0.7 units/ml Monitor platelets by anticoagulation protocol: Yes  Plan:  Heparin 2000 unit bolus x 1 Increase heparin drip rate to 1350 units/hr Draw heparin level in 6 hours and adjust as needed to achieve goal CBC and platelet count daily while on heparin   Arelia SneddonMason, Iyesha Such Anne 02/04/2018,5:13 AM

## 2018-02-04 NOTE — Progress Notes (Addendum)
Left a message for Dr Raymond GurneyGustav office to return my phone call in regards to pt POC and d/c medications.  Weiland Tomich, NP-C, CNM.  9:07 AM 02/04/18

## 2018-02-04 NOTE — Lactation Note (Signed)
Lactation Consultation Note Mom re-admitted for PE. Baby 234 days old. Mom has been BF every 2 hrs during the day. Baby wants to feed every hour at night for approx. 30 min. To hour. Asked mom if she thought baby was using her for a pacifier. Discussed consistency of milk. Asked mom if she has noticed a thick layer of fat in her milk after settled. Mom stated it has some. Mom had some milk on night stand from 15 hrs ago. About 20 -30ml. Mom stated she BF baby.  That milk hardly had any fat on top. Baby looks very healthy, chunky fat baby. Wt. 9 lb + Mom stated she gave the baby 3 oz. And he acts like he's starving and wants more. Mom feels that he is over eating, plus she feels that she can't keep up with him wanting to feed so much. Discussed w/mom may need to f/u for pre-post weights. Discussed massage breast, assessing breast before and afterwards for transfer. Baby has good I&O.  [facility].  Patient Name: Diana Lowe ZOXWR'UToday's Date: 02/04/2018     Maternal Data    Feeding    LATCH Score                   Interventions    Lactation Tools Discussed/Used     Consult Status      Francies Inch, Diamond NickelLAURA G 02/04/2018, 3:50 AM

## 2018-02-04 NOTE — Discharge Summary (Signed)
OB/GYN PP Readmission Discharge Summary     Patient Name: Diana Lowe DOB: 12/10/1995 MRN: [id number]  Date of admission: 02/03/2018 Delivering MD: Kenney HousemanPROTHERO, NANCY JEAN  Date of delivery: 01/30/2018, PP readmission for below stated problems.  Type of delivery: SVD  LOS# 1  Newborn Data: From 01/30/2018 Live born female  Birth Weight: 9 lb 4.5 oz (4210 g) APGAR: 8, 9  Newborn Delivery   Birth date/time:  01/30/2018 21:39:00 Delivery type:  Vaginal, Spontaneous    Admitting diagnosis: Abdominal pain [R10.9] Preeclampsia in postpartum period [O14.95] Acute pulmonary embolism without acute cor pulmonale, unspecified pulmonary embolism type (HCC) [I26.99]     Secondary diagnosis:  Active Problems:   Preeclampsia   Pulmonary embolism during puerperium   Uncomplicated labor and delivery process.    History of Present Illness: Ms. Diana Lowe is a 22 y.o. female, G1P1001, who presents at 5 days PP post having and viable fetus at 149w3d weeks gestation. The patient [facility]  [facility]  Her pregnancy has been uncomplicated except PP period now complicated  by: Patient Active Problem List   Diagnosis Date Noted  . Preeclampsia 02/03/2018  . Pulmonary embolism, blood-clot, obstetric, postpartum condition 02/03/2018  . Pulmonary embolism during puerperium 02/03/2018   Hospital course:   HISTORY OF CURRENT ILLNESS: Diana Lowe is a 22 year old [place] woman currently day 5 from uncomplicated delivery of a baby boy 01/30/2018.  Yesterday morning, 02/03/2018, around 5 AM, she woke up with back pain.  This changed to a chest pressure sensation, not associated with shortness of breath, cough, or pleurisy.  This was not relieved by change in position or by trying reflux medications as was suggested by primary care.  Accordingly she presented to the emergency room approximately 6:30 in the morning where she was  found to be hypertensive and to have some swelling of the left lower extremity.  Bilateral Doppler ultrasonography showed no evidence of DVT.  However a CT angiogram of the chest showed a calcified clot in the left lower lobe with associated subpleural changes.   In-Pt Course:  The patient was started on intravenous heparin and hemoc consulted to evaluate for possible hypercoagulable state. Pt has remained on heparin drip, which I was informed to d/c it now then start SQ Lovenox in 1 hour. Pharmacy was consulted on POC and Lovenox usage with dosing verified. Pt remain stable in bed with family at bedside, Dr Mora ApplPinn rounded on pt as well as I, she informed the family of POC, family denies any further questions. Pt is to received one dose of SQ lovenox in pt, then be discharge home in stable condition. Pt is asymptomatic now and denies CP, SOB, n, v, d, rashes, fever, no swelling, vision changes or HA. Hospital stay has remained uneventful.   Physical exam  Vitals:   02/04/18 0331 02/04/18 0600 02/04/18 0812 02/04/18 1146  BP: 129/80  126/86 117/82  Pulse: (!) 56  (!) 55 64  Resp: 18  18 18   Temp: 97.8 F (36.6 C)  97.9 F (36.6 C) 98 F (36.7 C)  TempSrc: Oral  Oral Oral  SpO2: 99% 95% 100% 100%  Weight:      Height:       General: alert, cooperative and no distress  CV: No murmurs, RRR Lungs: CTA-Bi Lochia: appropriate Uterine Fundus: firm DVT Evaluation: No evidence of DVT seen on physical exam. Negative Homan's sign. No cords or calf tenderness. No significant  calf/ankle edema.  Labs: Lab Results  Component Value Date   WBC 9.6 02/04/2018   HGB 12.0 02/04/2018   HCT 36.6 02/04/2018   MCV 85.7 02/04/2018   PLT 232 02/04/2018   CMP Latest Ref Rng & Units 02/04/2018  Glucose 70 - 99 mg/dL 409(W106(H)  BUN 6 - 20 mg/dL 11  Creatinine 1.190.44 - 1.471.00 mg/dL 8.290.77  Sodium 562135 - 130145 mmol/L 136  Potassium 3.5 - 5.1 mmol/L 3.3(L)  Chloride 98 - 111 mmol/L 104  CO2 22 - 32 mmol/L 21(L)   Calcium 8.9 - 10.3 mg/dL 8.6(V8.7(L)  Total Protein 6.5 - 8.1 g/dL 6.1(L)  Total Bilirubin 0.3 - 1.2 mg/dL 0.9  Alkaline Phos 38 - 126 U/L 167(H)  AST 15 - 41 U/L 45(H)  ALT 0 - 44 U/L 34    Date of discharge: 02/04/2018 Discharge Diagnoses:  Patient Active Problem List   Diagnosis Date Noted  . Preeclampsia 02/03/2018  . Pulmonary embolism, blood-clot, obstetric, postpartum condition 02/03/2018  . Pulmonary embolism during puerperium 02/03/2018   Discharge instruction: per PE instructions and baby and me booklet.   After visit meds:  Allergies as of 02/04/2018   No Known Allergies     Medication List    TAKE these medications   enoxaparin 100 MG/ML injection Commonly known as:  LOVENOX Inject 1 mL (100 mg total) into the skin daily. Start taking on:  02/05/2018   ferrous sulfate 325 (65 FE) MG tablet Take 1 tablet (325 mg total) by mouth daily with breakfast.   ibuprofen 600 MG tablet Commonly known as:  ADVIL,MOTRIN Take 1 tablet (600 mg total) by mouth every 6 (six) hours as needed for mild pain or moderate pain.   prenatal multivitamin Tabs tablet Take 1 tablet by mouth daily at 12 noon.       Discharge Follow Up:  Follow-up Information    [facility] Follow up in 1 week(s).   Specialty:  Obstetrics and Gynecology Contact information: [address] [place] [phone]       [facility] Follow up.   Why:  Lowella DellGustav C Magrinat, MD   in 3 weeks, office will call you .  Medical Oncology and Hematology [facility] [address] [place], [place] [address] Tel. [phone] Fax. [phone]          Dr Mora ApplPinn aware of plan of care, and verbalizes agreement.   UnionJade Laurie Lovejoy, NP-C, CNM 02/04/2018, 1:54 PM  Dale DurhamMONTANA, Tavari Loadholt, FNP

## 2018-02-04 NOTE — Progress Notes (Signed)
ANTICOAGULATION CONSULT NOTE - Follow Up Consult  Pharmacy Consult for Heparin Indication: Pulmonary embolus  No Known Allergies  Patient Measurements: Height: 5\' 3"  (160 cm) Weight: 147 lb (66.7 kg) IBW/kg (Calculated) : 52.4  Vital Signs: Temp: 98 F (36.7 C) (08/20 1146) Temp Source: Oral (08/20 1146) BP: 117/82 (08/20 1146) Pulse Rate: 64 (08/20 1146)  Labs: Recent Labs    02/03/18 0630 02/04/18 0050 02/04/18 0348 02/04/18 0350 02/04/18 1117  HGB 11.5*  --  12.0  --   --   HCT 34.7*  --  36.6  --   --   PLT 188  --  232  --   --   APTT 28  --   --   --   --   LABPROT 12.1  --  12.3  --   --   INR 0.90  --  0.92  --   --   HEPARINUNFRC  --  <0.10*  --  <0.10* 0.30  CREATININE 0.75  --  0.77  --   --     Estimated Creatinine Clearance: 99.5 mL/min (by C-G formula based on SCr of 0.77 mg/dL).   Medications:  Heparin infusion 8/19>>  Assessment: 22 yo female s/p SVD with left sided PE per spiral CT. IV heparin started at 1100 units/hr after 4000 unit IV bolus with initial heparin level reported as 0.1 unit/ml with result verified with recollection and with IV site patent. Pt with no reported adverse effects from anticoagulation. Per MD note, plan to convert heparin drip to Lovenox after 24 hrs  Heparin increased to 1350 units per hour this am resulting in therapeutic heparin level of 0.3 units/mL.  Discussed care plans with Lesly RubensteinJade, FNP.  Per Heme/Onc recommendation, plan is to transition patient to lovenox 1.5 mg/kg q24h this afternoon.  Advised Jade to discontinue heparin and start lovenox 1 hour after heparin d/c'd.  Goal of Therapy:  Heparin level goal 0.3-0.7 units/ml Monitor platelets by anticoagulation protocol: Yes  Plan:   See above - NP has ordered d/c heparin and start lovenox 100 mg sq q24h (1.5 mg/kg) one hour after heparin d/c'd.   Patient has f/u appts with OB and Hematology   Drusilla KannerGrimsley, Bodi [place]i Lydia 02/04/2018,1:33 PM

## 2018-02-04 NOTE — Progress Notes (Signed)
Lab notified of stat blood work ordered for 2300

## 2018-02-04 NOTE — Progress Notes (Signed)
MD Progress Note  Subjective: Patient states that she feels much better. She reports ability to breath more.  No chest pain, no shortness of breath. She denies nausea or vomiting, no headache,  Or blurry vision    Objective: I have reviewed patient's vital signs, medications and labs.  General: alert, cooperative and no distress   Assessment/Plan: 22 year old Para 1 readmitted post partum day 5 for Pulmonary Embolus She is being followed by Heme/Onc, per their recs, patient can be  Discharged home today with close follow up in the office.  Patient to be d/c'd home on therapeutic dose of LWMH heparin 1.5mg  / kg / 24 hours Discussed at length with patient and her mother any concerning signs to watch out for  And return precautions to include chest pain / pressure, shortness of breath, nausea, vomiting Feeling dizzy etc.  Patient also with elevated BPs on admission, now normotensive.  She was  Counseled about post partum preeclampsia.  She will make an appointment to be seen in office next week.  She has f/u appointment scheduled with Hematology.    LOS: 1 day    Treg Diemer STACIA 02/04/2018, 1:12 PM

## 2018-02-04 NOTE — Progress Notes (Signed)
Lactation Note: Mom concerned that baby is feeding so much during the night ( q 1- 1 1/2 hours) and wants to sleep more during the day. Reports he is latching well and breasts are softer after nursing. Reports she is able to pump 6 oz total when she pumps. Reviewed importance of deep latch and listening for swallows while he is nursing. Encouraged breast compression during nursing. She is taking him off the breast after 15 min. Encouraged to nurse a little longer 20-25 min to make sure he is getting the hind milk and then go to the second breast. Praise given for her efforts. Has Spectra pump for home. Encouraged to sleep whenever baby is sleeping. No questions at present. Reviewed our phone number, OP appointments and BFSG as resources for support after DC. To call prn

## 2018-02-05 ENCOUNTER — Inpatient Hospital Stay (HOSPITAL_COMMUNITY): Admission: RE | Admit: 2018-02-05 | Payer: Medicaid Other | Source: Ambulatory Visit

## 2018-02-05 ENCOUNTER — Ambulatory Visit (HOSPITAL_COMMUNITY)
Admit: 2018-02-05 | Discharge: 2018-02-05 | Disposition: A | Discharge status: Home / Self Care | Payer: Medicaid Other | Attending: Women's Health | Admitting: Women's Health

## 2018-02-05 ENCOUNTER — Observation Stay (HOSPITAL_COMMUNITY)
Admission: AD | Admit: 2018-02-05 | Discharge: 2018-02-07 | Disposition: A | Discharge status: Home / Self Care | Payer: Medicaid Other | Source: Ambulatory Visit | Attending: Obstetrics and Gynecology | Admitting: Obstetrics and Gynecology

## 2018-02-05 ENCOUNTER — Encounter (HOSPITAL_COMMUNITY): Payer: Self-pay

## 2018-02-05 ENCOUNTER — Encounter (HOSPITAL_COMMUNITY): Payer: Self-pay | Admitting: Anesthesiology

## 2018-02-05 DIAGNOSIS — O1415 Severe pre-eclampsia, complicating the puerperium: Secondary | ICD-10-CM | POA: Diagnosis not present

## 2018-02-05 DIAGNOSIS — O1495 Unspecified pre-eclampsia, complicating the puerperium: Secondary | ICD-10-CM | POA: Diagnosis present

## 2018-02-05 DIAGNOSIS — Z862 Personal history of diseases of the blood and blood-forming organs and certain disorders involving the immune mechanism: Secondary | ICD-10-CM | POA: Insufficient documentation

## 2018-02-05 DIAGNOSIS — R51 Headache: Secondary | ICD-10-CM | POA: Diagnosis not present

## 2018-02-05 DIAGNOSIS — Z86711 Personal history of pulmonary embolism: Secondary | ICD-10-CM | POA: Diagnosis not present

## 2018-02-05 DIAGNOSIS — O9989 Other specified diseases and conditions complicating pregnancy, childbirth and the puerperium: Principal | ICD-10-CM | POA: Insufficient documentation

## 2018-02-05 HISTORY — DX: Other pulmonary embolism without acute cor pulmonale: I26.99

## 2018-02-05 LAB — COMPREHENSIVE METABOLIC PANEL
ALT: 32 U/L (ref 0–44)
AST: 33 U/L (ref 15–41)
Albumin: 3.4 g/dL — ABNORMAL LOW (ref 3.5–5.0)
Alkaline Phosphatase: 176 U/L — ABNORMAL HIGH (ref 38–126)
Anion gap: 9 (ref 5–15)
BUN: 14 mg/dL (ref 6–20)
CHLORIDE: 105 mmol/L (ref 98–111)
CO2: 22 mmol/L (ref 22–32)
Calcium: 9 mg/dL (ref 8.9–10.3)
Creatinine, Ser: 0.82 mg/dL (ref 0.44–1.00)
Glucose, Bld: 100 mg/dL — ABNORMAL HIGH (ref 70–99)
POTASSIUM: 3.8 mmol/L (ref 3.5–5.1)
SODIUM: 136 mmol/L (ref 135–145)
Total Bilirubin: 0.7 mg/dL (ref 0.3–1.2)
Total Protein: 6.7 g/dL (ref 6.5–8.1)

## 2018-02-05 LAB — PROTEIN / CREATININE RATIO, URINE
CREATININE, URINE: 67 mg/dL
PROTEIN CREATININE RATIO: 0.09 mg/mg{creat} (ref 0.00–0.15)
TOTAL PROTEIN, URINE: 6 mg/dL

## 2018-02-05 LAB — CBC WITH DIFFERENTIAL/PLATELET
BASOS ABS: 0 10*3/uL (ref 0.0–0.1)
Basophils Relative: 0 %
EOS ABS: 0.2 10*3/uL (ref 0.0–0.7)
EOS PCT: 2 %
HCT: 36.7 % (ref 36.0–46.0)
Hemoglobin: 12 g/dL (ref 12.0–15.0)
LYMPHS ABS: 2.8 10*3/uL (ref 0.7–4.0)
LYMPHS PCT: 34 %
MCH: 28.1 pg (ref 26.0–34.0)
MCHC: 32.7 g/dL (ref 30.0–36.0)
MCV: 85.9 fL (ref 78.0–100.0)
MONO ABS: 0.5 10*3/uL (ref 0.1–1.0)
Monocytes Relative: 6 %
Neutro Abs: 4.7 10*3/uL (ref 1.7–7.7)
Neutrophils Relative %: 58 %
Platelets: 216 10*3/uL (ref 150–400)
RBC: 4.27 MIL/uL (ref 3.87–5.11)
RDW: 13.9 % (ref 11.5–15.5)
WBC: 8.1 10*3/uL (ref 4.0–10.5)

## 2018-02-05 LAB — CARDIOLIPIN ANTIBODIES, IGG, IGM, IGA
Anticardiolipin IgA: <9 APL U/mL (ref 0–11)
Anticardiolipin IgG: <9 GPL U/mL (ref 0–14)

## 2018-02-05 LAB — BETA-2-GLYCOPROTEIN I ABS, IGG/M/A
Beta-2-Glycoprotein I IgA: <9 GPI IgA units (ref 0–25)
Beta-2-Glycoprotein I IgM: <9 GPI IgM units (ref 0–32)

## 2018-02-05 LAB — URIC ACID: URIC ACID, SERUM: 7.1 mg/dL (ref 2.5–7.1)

## 2018-02-05 LAB — URINALYSIS, ROUTINE W REFLEX MICROSCOPIC
Bacteria, UA: NONE SEEN
Bilirubin Urine: NEGATIVE
GLUCOSE, UA: NEGATIVE mg/dL
KETONES UR: NEGATIVE mg/dL
Nitrite: NEGATIVE
PH: 6 (ref 5.0–8.0)
Protein, ur: NEGATIVE mg/dL
SPECIFIC GRAVITY, URINE: 1.009 (ref 1.005–1.030)

## 2018-02-05 LAB — PATHOLOGIST SMEAR REVIEW

## 2018-02-05 LAB — LACTATE DEHYDROGENASE: LDH: 221 U/L — AB (ref 98–192)

## 2018-02-05 MED ORDER — LABETALOL HCL 5 MG/ML IV SOLN
20.0000 mg | INTRAVENOUS | Status: DC | PRN
  Administered 2018-02-05: 20 mg via INTRAVENOUS

## 2018-02-05 MED ORDER — LABETALOL HCL 5 MG/ML IV SOLN
80.0000 mg | INTRAVENOUS | Status: DC | PRN
  Administered 2018-02-05: 80 mg via INTRAVENOUS
  Filled 2018-02-05: qty 16

## 2018-02-05 MED ORDER — LABETALOL HCL 5 MG/ML IV SOLN: 80.0000 mg | INTRAVENOUS | Status: DC | PRN

## 2018-02-05 MED ORDER — ACETAMINOPHEN 325 MG PO TABS
650.0000 mg | ORAL_TABLET | Freq: Four times a day (QID) | ORAL | Status: DC | PRN
  Administered 2018-02-05 – 2018-02-07 (×3): 650 mg via ORAL
  Filled 2018-02-05 (×3): qty 2

## 2018-02-05 MED ORDER — LACTATED RINGERS IV SOLN
INTRAVENOUS | Status: DC
  Administered 2018-02-05 – 2018-02-06 (×2): via INTRAVENOUS

## 2018-02-05 MED ORDER — OXYCODONE HCL 5 MG PO TABS
5.0000 mg | ORAL_TABLET | Freq: Three times a day (TID) | ORAL | Status: DC | PRN
  Administered 2018-02-06: 5 mg via ORAL
  Filled 2018-02-05 (×2): qty 1

## 2018-02-05 MED ORDER — LABETALOL HCL 5 MG/ML IV SOLN: 40.0000 mg | INTRAVENOUS | Status: DC | PRN

## 2018-02-05 MED ORDER — IOPAMIDOL (ISOVUE-370) INJECTION 76%
INTRAVENOUS | Status: AC
  Filled 2018-02-05: qty 100

## 2018-02-05 MED ORDER — MAGNESIUM SULFATE BOLUS VIA INFUSION
4.0000 g | Freq: Once | INTRAVENOUS | Status: AC
  Administered 2018-02-05: 4 g via INTRAVENOUS
  Filled 2018-02-05: qty 500

## 2018-02-05 MED ORDER — LABETALOL HCL 5 MG/ML IV SOLN
INTRAVENOUS | Status: AC
  Administered 2018-02-05: 40 mg via INTRAVENOUS
  Filled 2018-02-05: qty 4

## 2018-02-05 MED ORDER — IOPAMIDOL (ISOVUE-370) INJECTION 76%
100.0000 mL | Freq: Once | INTRAVENOUS | Status: AC | PRN
  Administered 2018-02-05: 100 mL via INTRAVENOUS

## 2018-02-05 MED ORDER — ENOXAPARIN SODIUM 100 MG/ML ~~LOC~~ SOLN
100.0000 mg | SUBCUTANEOUS | Status: DC
  Administered 2018-02-05 – 2018-02-06 (×2): 100 mg via SUBCUTANEOUS
  Filled 2018-02-05 (×3): qty 1

## 2018-02-05 MED ORDER — LABETALOL HCL 5 MG/ML IV SOLN: 20.0000 mg | INTRAVENOUS | Status: DC | PRN

## 2018-02-05 MED ORDER — LABETALOL HCL 5 MG/ML IV SOLN
40.0000 mg | INTRAVENOUS | Status: DC | PRN
  Administered 2018-02-05: 40 mg via INTRAVENOUS
  Filled 2018-02-05: qty 8

## 2018-02-05 MED ORDER — MAGNESIUM SULFATE 40 G IN LACTATED RINGERS - SIMPLE
1.0000 g/h | INTRAVENOUS | Status: AC
  Administered 2018-02-05: 2 g/h via INTRAVENOUS
  Filled 2018-02-05: qty 500

## 2018-02-05 MED ORDER — LACTATED RINGERS IV BOLUS
500.0000 mL | Freq: Once | INTRAVENOUS | Status: AC
  Administered 2018-02-05: 500 mL via INTRAVENOUS

## 2018-02-05 MED ORDER — HYDRALAZINE HCL 20 MG/ML IJ SOLN: 10.0000 mg | INTRAMUSCULAR | Status: DC | PRN

## 2018-02-05 MED ORDER — IBUPROFEN 600 MG PO TABS
600.0000 mg | ORAL_TABLET | Freq: Four times a day (QID) | ORAL | Status: DC | PRN
  Filled 2018-02-05: qty 1

## 2018-02-05 NOTE — MAU Note (Signed)
Unsuccessful IV attempt x 2, L forearm, L antecubital.  CRNA contacted regarding IV start.

## 2018-02-05 NOTE — H&P (Signed)
Diana Lowe Diana Lowe is a 22 y.o. female presenting for preeclampsia with severe features. Patient was diagnosed with mild preeclampsia on 02/03/18 but now is presenting with severe features (headache/head pressure) and severe range blood pressures. Patient was also recently diagnosed with a pulmonary embolism and is on 119m Lovenox QD.     OB History    Gravida  1   Para  1   Term  1   Preterm  0   AB  0   Living  1     SAB  0   TAB  0   Ectopic  0   Multiple  0   Live Births  1          Past Medical History:  Diagnosis Date  . Anemia   . [place]ocyt[place]   . Pulmonary embolism ([facility]    Past Surgical History:  Procedure Laterality Date  . WISDOM TOOTH EXTRACTION     Family History: family history includes Diabetes in her maternal grandmother; Hypertension in her maternal grandfather. Social History:  reports that she has never smoked. She has never used smokeless tobacco. She reports that she does not drink alcohol or use drugs.  Review of Systems  Eyes: Negative for blurred vision.  Respiratory: Negative for shortness of breath.   Cardiovascular: Positive for chest pain.  Neurological: Positive for headaches.  All other systems reviewed and are negative.  Vitals:   02/05/18 2100 02/05/18 2126 02/05/18 2139 02/05/18 2142  BP: (!) 163/97 (!) 164/92  (!) 164/93  Pulse: (!) 47 (!) 56  60  Resp:      Temp:      TempSrc:      SpO2:   99%   Weight:       Results for orders placed or performed during the hospital encounter of 02/05/18 (from the past 72 hour(s))  CBC with Differential/Platelet     Status: None   Collection Time: 02/05/18  5:39 PM  Result Value Ref Range   WBC 8.1 4.0 - 10.5 K/uL   RBC 4.27 3.87 - 5.11 MIL/uL   Hemoglobin 12.0 12.0 - 15.0 g/dL   HCT 36.7 36.0 - 46.0 %   MCV 85.9 78.0 - 100.0 fL   MCH 28.1 26.0 - 34.0 pg   MCHC 32.7 30.0 - 36.0 g/dL   RDW 13.9 11.5 - 15.5 %   Platelets 216 150 - 400 K/uL   Neutrophils  Relative % 58 %   Neutro Abs 4.7 1.7 - 7.7 K/uL   Lymphocytes Relative 34 %   Lymphs Abs 2.8 0.7 - 4.0 K/uL   Monocytes Relative 6 %   Monocytes Absolute 0.5 0.1 - 1.0 K/uL   Eosinophils Relative 2 %   Eosinophils Absolute 0.2 0.0 - 0.7 K/uL   Basophils Relative 0 %   Basophils Absolute 0.0 0.0 - 0.1 K/uL    Comment: [facility] [address] [place] [id number] Comprehensive metabolic panel     Status: Abnormal   Collection Time: 02/05/18  5:39 PM  Result Value Ref Range   Sodium 136 135 - 145 mmol/L   Potassium 3.8 3.5 - 5.1 mmol/L   Chloride 105 98 - 111 mmol/L   CO2 22 22 - 32 mmol/L   Glucose, Bld 100 (H) 70 - 99 mg/dL   BUN 14 6 - 20 mg/dL   Creatinine, Ser 0.82 0.44 - 1.00 mg/dL   Calcium 9.0 8.9 - 10.3 mg/dL  Total Protein 6.7 6.5 - 8.1 g/dL   Albumin 3.4 (L) 3.5 - 5.0 g/dL   AST 33 15 - 41 U/L   ALT 32 0 - 44 U/L   Alkaline Phosphatase 176 (H) 38 - 126 U/L   Total Bilirubin 0.7 0.3 - 1.2 mg/dL   GFR calc non Af Amer >60 >60 mL/min   GFR calc Af Amer >60 >60 mL/min    Comment: (NOTE) The eGFR has been calculated using the CKD EPI equation. This calculation has not been validated in all clinical situations. eGFR's persistently <60 mL/min signify possible Chronic Kidney Disease.    Anion gap 9 5 - 15    Comment: Performed at [facility], [address] [place], [place] [address]  Uric acid     Status: None   Collection Time: 02/05/18  5:39 PM  Result Value Ref Range   Uric Acid, Serum 7.1 2.5 - 7.1 mg/dL    Comment: Performed at [facility], [address], [place], [place] [address]  Lactate dehydrogenase     Status: Abnormal   Collection Time: 02/05/18  5:39 PM  Result Value Ref Range   LDH 221 (H) 98 - 192 U/L    Comment: Performed at [facility], [address] [place], [place] [address]  Protein / creatinine ratio, urine     Status: None   Collection Time: 02/05/18  6:37 PM  Result Value Ref Range    Creatinine, Urine 67.00 mg/dL   Total Protein, Urine 6 mg/dL    Comment: NO NORMAL RANGE ESTABLISHED FOR THIS TEST   Protein Creatinine Ratio 0.09 0.00 - 0.15 mg/mg[Cre]    Comment: Performed at [facility], [address], [place], [place] [address]   Physical Exam  Nursing note and vitals reviewed. Constitutional: She is oriented to person, place, and time. She appears well-developed and well-nourished.  HENT:  Head: Normocephalic.  Eyes: Pupils are equal, round, and reactive to light.  Cardiovascular: Normal rate, regular rhythm and normal heart sounds.  Respiratory: Effort normal and breath sounds normal. No respiratory distress.  GI: Soft. Bowel sounds are normal.  Musculoskeletal: Normal range of motion.  Neurological: She is alert and oriented to person, place, and time.  Skin: Skin is warm and dry.  Psychiatric: She has a normal mood and affect. Her behavior is normal. Judgment and thought content normal.   CTA Scan: FINDINGS: CT HEAD  Brain: No evidence of acute infarction, hemorrhage, hydrocephalus, extra-axial collection or mass lesion/mass effect.  Vascular: No hyperdense vessel or unexpected calcification.  Skull: Normal. Negative for fracture or focal lesion.  Sinuses: Imaged portions are clear.  Orbits: No acute finding.  CTA HEAD  Anterior circulation: No significant stenosis, proximal occlusion, aneurysm, or vascular malformation.  Posterior circulation: No significant stenosis, proximal occlusion, aneurysm, or vascular malformation.  Venous sinuses: As permitted by contrast timing, patent.  Anatomic variants: Complete circle-of-Willis.  Delayed phase: No abnormal intracranial enhancement.  IMPRESSION: Normal CTA of the head.  Assessment/Plan: 22 y.o. G1P1 at 6 days postpartum  Preeclampsia with severe features Consulted Dr. Cletis Media and Dr. Simona Huh CTA Head to rule out blood clot (CTA was negative) Repeat [place] Labs Admit  patient to antepartum 24 hours of postpartum magnesium sulfate  Labetalol per protocol for hypertension  Consulted Pharmacist: Ordered Lovenox 1.17m/kg QD for pulmonary embolism management  EMarikay Alar8/21/2019, 10:04 PM

## 2018-02-05 NOTE — MAU Note (Signed)
Pt was sent from the office for labs and "pressure" in her head. 4/10 and elevated BP. Pt was admitted for a PE and discharged yesterday. Pt said her eyelid felt "heavy" on her left side.

## 2018-02-05 NOTE — Progress Notes (Signed)
PT reporting neck pain and chest pain similar to pain she had before w/ her PE. Called Kathalene FramesEllis Greer to bedside to evalutate. Mag sulfate started in MAU. Patient feeling better after infusion started. Patient wanted to breastfeed baby before transporting to 3rd floor. Patient rating pain 7 and says check and neck pain feel better. Patient taken to 3rd floor. [facility].

## 2018-02-06 ENCOUNTER — Other Ambulatory Visit: Payer: Self-pay

## 2018-02-06 LAB — COMPREHENSIVE METABOLIC PANEL
ALK PHOS: 154 U/L — AB (ref 38–126)
ALT: 26 U/L (ref 0–44)
AST: 30 U/L (ref 15–41)
Albumin: 3 g/dL — ABNORMAL LOW (ref 3.5–5.0)
Anion gap: 10 (ref 5–15)
BUN: 10 mg/dL (ref 6–20)
CALCIUM: 7.7 mg/dL — AB (ref 8.9–10.3)
CO2: 20 mmol/L — AB (ref 22–32)
CREATININE: 0.84 mg/dL (ref 0.44–1.00)
Chloride: 107 mmol/L (ref 98–111)
Glucose, Bld: 93 mg/dL (ref 70–99)
Potassium: 3.2 mmol/L — ABNORMAL LOW (ref 3.5–5.1)
Sodium: 137 mmol/L (ref 135–145)
Total Bilirubin: 0.7 mg/dL (ref 0.3–1.2)
Total Protein: 6 g/dL — ABNORMAL LOW (ref 6.5–8.1)

## 2018-02-06 LAB — CBC WITH DIFFERENTIAL/PLATELET
BASOS PCT: 0 %
Basophils Absolute: 0 10*3/uL (ref 0.0–0.1)
EOS ABS: 0.1 10*3/uL (ref 0.0–0.7)
EOS PCT: 1 %
HCT: 36.2 % (ref 36.0–46.0)
HEMOGLOBIN: 11.9 g/dL — AB (ref 12.0–15.0)
Lymphocytes Relative: 37 %
Lymphs Abs: 3.2 10*3/uL (ref 0.7–4.0)
MCH: 27.9 pg (ref 26.0–34.0)
MCHC: 32.9 g/dL (ref 30.0–36.0)
MCV: 85 fL (ref 78.0–100.0)
MONOS PCT: 5 %
Monocytes Absolute: 0.4 10*3/uL (ref 0.1–1.0)
Neutro Abs: 4.8 10*3/uL (ref 1.7–7.7)
Neutrophils Relative %: 57 %
PLATELETS: 243 10*3/uL (ref 150–400)
RBC: 4.26 MIL/uL (ref 3.87–5.11)
RDW: 14 % (ref 11.5–15.5)
WBC: 8.5 10*3/uL (ref 4.0–10.5)

## 2018-02-06 LAB — MAGNESIUM: MAGNESIUM: 5.1 mg/dL — AB (ref 1.7–2.4)

## 2018-02-06 LAB — PROTEIN S ACTIVITY: Protein S Activity: 62 % — ABNORMAL LOW (ref 63–140)

## 2018-02-06 LAB — LUPUS ANTICOAGULANT PANEL
DRVVT: 31.7 s (ref 0.0–47.0)
PTT LA: 43.6 s (ref 0.0–51.9)

## 2018-02-06 LAB — PROTEIN S, TOTAL: Protein S Ag, Total: 61 % (ref 60–150)

## 2018-02-06 LAB — PROTEIN C ACTIVITY: Protein C Activity: 110 % (ref 73–180)

## 2018-02-06 MED ORDER — BUTALBITAL-APAP-CAFFEINE 50-325-40 MG PO TABS
1.0000 | ORAL_TABLET | Freq: Four times a day (QID) | ORAL | Status: DC | PRN
  Administered 2018-02-06: 1 via ORAL
  Filled 2018-02-06: qty 1

## 2018-02-06 NOTE — Plan of Care (Signed)

## 2018-02-06 NOTE — Progress Notes (Signed)
Post Partum Day 7 Called by RN to evaluate patient for double vision.  Subjective: Patient reports her eyelids feel heavy, feels dizzy and had double vision before, but since magnesium sulfate rate was decreased she has less double vision.  She denies blurry vision, scotomata, chest pain/shortnes of breath/nausea/vomiting/abdominal pain.    Objective: Blood pressure 129/77, pulse 78, temperature 98.2 F (36.8 C), temperature source Oral, resp. rate 18, weight 64.1 kg, SpO2 100 %, currently breastfeeding. Vitals:   02/06/18 0600 02/06/18 0855 02/06/18 1131 02/06/18 1425  BP: (!) 131/91 115/81 122/81 129/77  Pulse: 74 (!) 56 78   Resp: 18 18 18    Temp:  98.3 F (36.8 C) 98.2 F (36.8 C)   TempSrc:  Oral Oral   SpO2: 99% 99% 100%   Weight:        Urine output: 700 cc/4 hrs.   Physical Exam:  General: alert, cooperative and no distress CVS: s1, s2, RRR Pulm: Clear to auscultation bilaterally Uterine Fundus: firm DVT Evaluation: No evidence of DVT seen on physical exam. 1+ patellar reflex bilaterally.   Recent Labs    02/05/18 1739 02/06/18 0535  HGB 12.0 11.9*  HCT 36.7 36.2    Assessment/Plan: Post partum day 7 readmitted with preeclampsia with severe features on magnesium sulfate for seizure prophylaxis, with vision changes likely because of magnesium effects -Decrease magnesium sulfate rate to 1 g/hr, check magnesium sulfate levels -Plan to stop magnesium sulfate about 10 pm tonight.  -Continue with lovenox of recent pulmonary embolus.   LOS: 0 days   Konrad FelixKULWA,Chade Pitner WAKURU, MD.  02/06/2018, 3:35 PM

## 2018-02-06 NOTE — Progress Notes (Signed)
Hospital day # 0 postpartum x 1 week with pre eclampsia postpartum.  HX of PE anticoagulated  S:  States headache and pressure better.   No chest pressure.  Breastfeeding going well.        O: BP 115/81 (BP Location: Right Arm)   Pulse (!) 56   Temp 98.3 F (36.8 C) (Oral)   Resp 18   Wt 64.1 kg   SpO2 99%   BMI 25.03 kg/m        Vitals:   02/06/18 0600 02/06/18 0855  BP: (!) 131/91 115/81  Pulse: 74 (!) 56  Resp: 18 18  Temp:  98.3 F (36.8 C)  SpO2: 99% 99%        Extremities: extremities normal, atraumatic, no cyanosis or edema and no significant edema and no signs of DVT          Labs:  Results for orders placed or performed during the hospital encounter of 02/05/18 (from the past 24 hour(s))  CBC with Differential/Platelet     Status: None   Collection Time: 02/05/18  5:39 PM  Result Value Ref Range   WBC 8.1 4.0 - 10.5 K/uL   RBC 4.27 3.87 - 5.11 MIL/uL   Hemoglobin 12.0 12.0 - 15.0 g/dL   HCT 81.1 91.4 - 78.2 %   MCV 85.9 78.0 - 100.0 fL   MCH 28.1 26.0 - 34.0 pg   MCHC 32.7 30.0 - 36.0 g/dL   RDW 95.6 21.3 - 08.6 %   Platelets 216 150 - 400 K/uL   Neutrophils Relative % 58 %   Neutro Abs 4.7 1.7 - 7.7 K/uL   Lymphocytes Relative 34 %   Lymphs Abs 2.8 0.7 - 4.0 K/uL   Monocytes Relative 6 %   Monocytes Absolute 0.5 0.1 - 1.0 K/uL   Eosinophils Relative 2 %   Eosinophils Absolute 0.2 0.0 - 0.7 K/uL   Basophils Relative 0 %   Basophils Absolute 0.0 0.0 - 0.1 K/uL  Comprehensive metabolic panel     Status: Abnormal   Collection Time: 02/05/18  5:39 PM  Result Value Ref Range   Sodium 136 135 - 145 mmol/L   Potassium 3.8 3.5 - 5.1 mmol/L   Chloride 105 98 - 111 mmol/L   CO2 22 22 - 32 mmol/L   Glucose, Bld 100 (H) 70 - 99 mg/dL   BUN 14 6 - 20 mg/dL   Creatinine, Ser 5.78 0.44 - 1.00 mg/dL   Calcium 9.0 8.9 - 46.9 mg/dL   Total Protein 6.7 6.5 - 8.1 g/dL   Albumin 3.4 (L) 3.5 - 5.0 g/dL   AST 33 15 - 41 U/L   ALT 32 0 - 44 U/L   Alkaline  Phosphatase 176 (H) 38 - 126 U/L   Total Bilirubin 0.7 0.3 - 1.2 mg/dL   GFR calc non Af Amer >60 >60 mL/min   GFR calc Af Amer >60 >60 mL/min   Anion gap 9 5 - 15  Uric acid     Status: None   Collection Time: 02/05/18  5:39 PM  Result Value Ref Range   Uric Acid, Serum 7.1 2.5 - 7.1 mg/dL  Lactate dehydrogenase     Status: Abnormal   Collection Time: 02/05/18  5:39 PM  Result Value Ref Range   LDH 221 (H) 98 - 192 U/L  Protein / creatinine ratio, urine     Status: None   Collection Time: 02/05/18  6:37 PM  Result Value Ref Range  Creatinine, Urine 67.00 mg/dL   Total Protein, Urine 6 mg/dL   Protein Creatinine Ratio 0.09 0.00 - 0.15 mg/mg[Cre]  Urinalysis, Routine w reflex microscopic     Status: Abnormal   Collection Time: 02/05/18  6:37 PM  Result Value Ref Range   Color, Urine YELLOW YELLOW   APPearance CLEAR CLEAR   Specific Gravity, Urine 1.009 1.005 - 1.030   pH 6.0 5.0 - 8.0   Glucose, UA NEGATIVE NEGATIVE mg/dL   Hgb urine dipstick LARGE (A) NEGATIVE   Bilirubin Urine NEGATIVE NEGATIVE   Ketones, ur NEGATIVE NEGATIVE mg/dL   Protein, ur NEGATIVE NEGATIVE mg/dL   Nitrite NEGATIVE NEGATIVE   Leukocytes, UA LARGE (A) NEGATIVE   RBC / HPF 11-20 0 - 5 RBC/hpf   WBC, UA 21-50 0 - 5 WBC/hpf   Bacteria, UA NONE SEEN NONE SEEN   Squamous Epithelial / LPF 0-5 0 - 5   Mucus PRESENT   Comprehensive metabolic panel     Status: Abnormal   Collection Time: 02/06/18  5:35 AM  Result Value Ref Range   Sodium 137 135 - 145 mmol/L   Potassium 3.2 (L) 3.5 - 5.1 mmol/L   Chloride 107 98 - 111 mmol/L   CO2 20 (L) 22 - 32 mmol/L   Glucose, Bld 93 70 - 99 mg/dL   BUN 10 6 - 20 mg/dL   Creatinine, Ser 1.610.84 0.44 - 1.00 mg/dL   Calcium 7.7 (L) 8.9 - 10.3 mg/dL   Total Protein 6.0 (L) 6.5 - 8.1 g/dL   Albumin 3.0 (L) 3.5 - 5.0 g/dL   AST 30 15 - 41 U/L   ALT 26 0 - 44 U/L   Alkaline Phosphatase 154 (H) 38 - 126 U/L   Total Bilirubin 0.7 0.3 - 1.2 mg/dL   GFR calc non Af Amer >60  >60 mL/min   GFR calc Af Amer >60 >60 mL/min   Anion gap 10 5 - 15  CBC with Differential/Platelet     Status: Abnormal   Collection Time: 02/06/18  5:35 AM  Result Value Ref Range   WBC 8.5 4.0 - 10.5 K/uL   RBC 4.26 3.87 - 5.11 MIL/uL   Hemoglobin 11.9 (L) 12.0 - 15.0 g/dL   HCT 09.636.2 04.536.0 - 40.946.0 %   MCV 85.0 78.0 - 100.0 fL   MCH 27.9 26.0 - 34.0 pg   MCHC 32.9 30.0 - 36.0 g/dL   RDW 81.114.0 91.411.5 - 78.215.5 %   Platelets 243 150 - 400 K/uL   Neutrophils Relative % 57 %   Neutro Abs 4.8 1.7 - 7.7 K/uL   Lymphocytes Relative 37 %   Lymphs Abs 3.2 0.7 - 4.0 K/uL   Monocytes Relative 5 %   Monocytes Absolute 0.4 0.1 - 1.0 K/uL   Eosinophils Relative 1 %   Eosinophils Absolute 0.1 0.0 - 0.7 K/uL   Basophils Relative 0 %   Basophils Absolute 0.0 0.0 - 0.1 K/uL          Meds: Magnesium Sulfate x 24 hours.  Done at 2220 on */22/2019  A: Pt is a 22 yo G1P1001 7 days PP with pre eclampsia without severe features Hx of PE     stable  P: Continue current plan of care  Magnesium Sulfate til tonight at 2220      Upcoming tests/treatments:  None      MDs will follow  Kenney HousemanNancy Jean Camila Norville CNM, MSN 02/06/2018 11:09 AM

## 2018-02-07 LAB — FACTOR 5 LEIDEN

## 2018-02-07 LAB — PROTEIN C, TOTAL: PROTEIN C, TOTAL: 95 % (ref 60–150)

## 2018-02-07 MED ORDER — BLOOD PRESSURE KIT: 1.0000 | PACK | Freq: Two times a day (BID) | 0 refills | Status: DC

## 2018-02-07 NOTE — Progress Notes (Signed)
   02/07/18 1130  Vital Signs  BP 125/86  Pt requesting to go home. Dr Mora ApplPinn called & report given on above BP.  MD states she will come to unit soon.

## 2018-02-07 NOTE — Progress Notes (Signed)
Post Partum Day 8 - post partum readmission for severe preeclampsia Subjective: Patient w/o complaints feels "much better".  She denies HA, no blurry vision, no scotomata,  No RUQ pain. She denies chest pain or shortness of breath  Objective: Blood pressure 135/81, pulse (!) 54, temperature 98.9 F (37.2 C), temperature source Oral, resp. rate 18, weight 64.1 kg, SpO2 99 %, currently breastfeeding.  Physical Exam:  General: alert, cooperative and no distress Lochia: appropriate Uterine Fundus: firm DVT Evaluation: No evidence of DVT seen on physical exam. Negative Homan's sign. NEURO: 2+ DTRs  Recent Labs    02/05/18 1739 02/06/18 0535  HGB 12.0 11.9*  HCT 36.7 36.2    Assessment/Plan: 22 yo with second readmission now for preeclampsia w/ severe features.  Recently diagnosed with PE on therapeutic lovenox Patient's clinical symptoms improved and BPs are normal to mild range She is s/p magnesium sulfate therapy  Discussed with patient will wait until after lunch to monitor her sx and BPs She can potentially be discharged home this afternoon.    LOS: 0 days   Brendin Situ STACIA 02/07/2018, 9:16 AM

## 2018-02-07 NOTE — Progress Notes (Signed)
Pt & her mother have questions re: s/s of PreE & what to do if her mild h/a continues. Discussed s/s of PreE & that she can take Tylenol (education also completed) for h/a.  Pt states that she has a mild h/a with lightheadedness (1st complaint of latter symptom) & expresses concern that she will have to come back to hospital d/t h/a being the 1st symptom that brought her back for readmission.  Pt also wants to know if she should take a BP medication to prevent her having to come back to hospital if her h/a continues or gets worse.  Pt advised that even if she was taking BP medications, the recommendation would be to return to [facility] for evaluation. Advised pt & mother to speak with provider prior to d/c.  Dr Mora ApplPinn called & above reported. MD states she will notify CNM to come to unit to speak with pt & family.

## 2018-02-07 NOTE — Progress Notes (Signed)
MD interim note Subjective: no complaints and up ad lib Patient doing well no symptoms of pre E  Objective: Blood pressure 125/86, pulse (!) 52, temperature 98.8 F (37.1 C), temperature source Oral, resp. rate 18, weight 64.1 kg, SpO2 99 %, currently breastfeeding.  Physical Exam:  General: alert, cooperative and no distress  Recent Labs    02/05/18 1739 02/06/18 0535  HGB 12.0 11.9*  HCT 36.7 36.2    Assessment/Plan: Discharge home with pre E precautions Will continue therapeutic lovenox   LOS: 0 days   Kanin Lia STACIA 02/07/2018, 1:11 PM

## 2018-02-07 NOTE — Consult Note (Signed)
Mom has basic breastfeeding questions.  Answered questions and reminded of lactation outpatient services and support.  Mom is interested in breastfeeding support group.

## 2018-02-07 NOTE — Discharge Summary (Signed)
OB Discharge Summary     Patient Name: Diana PicketShianne Wingate Faison DOB: 01/13/1996 MRN: [id number]  Date of admission: 02/05/2018 Delivering MD: This patient has no babies on file.  Date of discharge: 02/07/2018  Admitting diagnosis: PP HBP Intrauterine pregnancy: Unknown     Secondary diagnosis:  Active Problems:   Preeclampsia in postpartum period  Additional problems: Post partum pulmonary embolus     Discharge diagnosis: Postpartum preeclampsia (severe)                                                                                                Post partum procedures:n/a  Hospital course:  Patient readmitted for postpartum preeclampsia with severe features.  She was placed on magnesium sulfate for seizure prophylaxis. Blood pressures normalized  And patient's symptoms improved on hospital day two.  Her therapeutic lovenox was continued During hospital course for her previously diagnosed PE.  Patient discharged home with precautions On HD #2  Physical exam  Vitals:   02/06/18 2356 02/07/18 0420 02/07/18 0755 02/07/18 1130  BP: 120/87 126/88 135/81 125/86  Pulse: (!) 59 72 (!) 54 (!) 52  Resp: 16 18 18 18   Temp: 98.7 F (37.1 C) 98.6 F (37 C) 98.9 F (37.2 C) 98.8 F (37.1 C)  TempSrc: Oral Oral Oral Oral  SpO2: 100% 99% 99% 99%  Weight:       Physical Exam:  General: alert, cooperative and no distress Lochia: appropriate Uterine Fundus: firm DVT Evaluation: No evidence of DVT seen on physical exam. Negative Homan's sign. NEURO: 2+ DTRs  Labs: Lab Results  Component Value Date   WBC 8.5 02/06/2018   HGB 11.9 (L) 02/06/2018   HCT 36.2 02/06/2018   MCV 85.0 02/06/2018   PLT 243 02/06/2018   CMP Latest Ref Rng & Units 02/06/2018  Glucose 70 - 99 mg/dL 93  BUN 6 - 20 mg/dL 10  Creatinine 4.090.44 - 8.111.00 mg/dL 9.140.84  Sodium 782135 - 956145 mmol/L 137  Potassium 3.5 - 5.1 mmol/L 3.2(L)  Chloride 98 - 111 mmol/L 107  CO2 22 - 32 mmol/L 20(L)  Calcium 8.9 - 10.3 mg/dL  7.7(L)  Total Protein 6.5 - 8.1 g/dL 6.0(L)  Total Bilirubin 0.3 - 1.2 mg/dL 0.7  Alkaline Phos 38 - 126 U/L 154(H)  AST 15 - 41 U/L 30  ALT 0 - 44 U/L 26    Discharge instruction: per After Visit Summary and "Baby and Me Booklet".  After visit meds:  Allergies as of 02/07/2018   No Known Allergies     Medication List    STOP taking these medications   ibuprofen 600 MG tablet Commonly known as:  ADVIL,MOTRIN     TAKE these medications   enoxaparin 100 MG/ML injection Commonly known as:  LOVENOX Inject 1 mL (100 mg total) into the skin daily.   ferrous sulfate 325 (65 FE) MG tablet Take 1 tablet (325 mg total) by mouth daily with breakfast.   prenatal multivitamin Tabs tablet Take 1 tablet by mouth daily at 12 noon.       Diet: low salt diet  Activity:  Advance as tolerated. Pelvic rest for 6 weeks.   Outpatient follow up:1 week Follow up Appt: Future Appointments  Date Time Provider Department Center  02/21/2018 11:00 AM [facility] 1 [facility] None  02/21/2018 11:30 AM Magrinat, Valentino Hue, MD [facility] None   Follow up Visit: one week in office for BP check  Postpartum contraception: None   02/07/2018 Ashawna Hanback, Sanjuana Mae, MD

## 2018-02-10 LAB — HOMOCYSTEINE: Homocysteine: 9.6 umol/L (ref 0.0–15.0)

## 2018-02-10 LAB — PROTHROMBIN GENE MUTATION

## 2018-02-20 NOTE — Progress Notes (Signed)
No show

## 2018-02-21 ENCOUNTER — Other Ambulatory Visit: Payer: Medicaid Other

## 2018-02-21 ENCOUNTER — Encounter: Payer: Self-pay | Admitting: Oncology

## 2018-02-21 ENCOUNTER — Ambulatory Visit: Payer: Medicaid Other | Admitting: Oncology

## 2018-02-25 ENCOUNTER — Inpatient Hospital Stay: Payer: Medicaid Other | Attending: Oncology | Admitting: Oncology

## 2018-02-25 ENCOUNTER — Telehealth: Payer: Self-pay | Admitting: Oncology

## 2018-02-25 VITALS — BP 111/70 | HR 56 | Temp 98.4°F | Resp 18 | Ht 63.0 in | Wt 134.6 lb

## 2018-02-25 DIAGNOSIS — I2699 Other pulmonary embolism without acute cor pulmonale: Secondary | ICD-10-CM | POA: Diagnosis not present

## 2018-02-25 DIAGNOSIS — Z7901 Long term (current) use of anticoagulants: Secondary | ICD-10-CM

## 2018-02-25 DIAGNOSIS — O1490 Unspecified pre-eclampsia, unspecified trimester: Secondary | ICD-10-CM

## 2018-02-25 DIAGNOSIS — O8823 Thromboembolism in the puerperium: Secondary | ICD-10-CM

## 2018-02-25 MED ORDER — ENOXAPARIN SODIUM 100 MG/ML ~~LOC~~ SOLN: 1.5000 mg/kg | SUBCUTANEOUS | 0 refills | Status: DC

## 2018-02-25 NOTE — Progress Notes (Signed)
[place]  [phone] [phone]     ID: Diana Lowe DOB: [birth year]  MR#: [id number]  JYN#:[id number]  Patient Care Team: Patient, No Pcp Per as PCP - General (General Practice) Crawford Givens, MD as Consulting Physician (Obstetrics and Gynecology) OTHER MD:  CHIEF COMPLAINT: Pulmonary embolism  CURRENT TREATMENT: Lovenox   HISTORY OF CURRENT ILLNESS: Diana Lowe Diana Lowe is a 22 year old [place] woman currently day 5 from uncomplicated delivery of a baby boy 01/30/2018.  This morning, 02/03/2018, around 5 AM, she woke up with back pain.  This changed to a chest pressure sensation, not associated with shortness of breath, cough, or pleurisy.  This was not relieved by change in position or by trying reflux medications as was suggested by primary care.  Accordingly she presented to the emergency room approximately 6:30 in the morning where she was found to be hypertensive and to have some swelling of the left lower extremity.  Bilateral Doppler ultrasonography showed no evidence of DVT.  However a CT angiogram of the chest showed a calcified clot in the left lower lobe with associated subpleural changes.  The patient was started on intravenous heparin and we were consulted to evaluate for possible hypercoagulable state.  INTERVAL HISTORY: [name] returns today for follow up of her pulmonary embolism. She continues on Lovenox injections, 100 mg/ml once per day. She tolerates this well. She injects it either in the left side of her stomach or the top of her left leg. She has some pain and minor bleeding if she injects in her stomach, but she denies any bleeding or pain after the leg injection. She denies any other bleeding issues. She has not had a menstrual period since giving birth, other than spotting from the birth. She had right leg swelling within the hospital, but this has resolved. She stopped taking iron because she was unaware that  she needed to continue this. She had mild stomach upset while taking iron during her pregnancy. She notes that she usually is a little anemic. She is taking pre-natal vitamins with iron. She continues to follow with her gynecologist, Dr. Charlesetta Garibaldi or Dr. Ileene Rubens.    REVIEW OF SYSTEMS: [name] reports that she has been good, and she is getting used to motherhood. She notes that she was seen the hospital for 2 more days for HTN. She was given magnesium and was monitored. She has a BP machine at home. She notes that her BP was high the first few days after being discharged fro the hospital, but this returned to normal. Her last BP reading she received at home was 92/73 on 02/22/2018. She is breastfeeding her son, and he enjoys cluster feeding, and she has not had issues with breastfeeding. She receives help both of her baby's grandmothers as well as her significant other. She is considering birth control options. She denies unusual headaches, visual changes, nausea, vomiting, or dizziness. There has been no unusual cough, phlegm production, or pleurisy. There has been no change in bowel or bladder habits. She denies unexplained fatigue or unexplained weight loss, bleeding, rash, or fever. A detailed review of systems was otherwise stable.     PAST MEDICAL HISTORY: Past Medical History:  Diagnosis Date  . Anemia   . Primary thrombocytopenia ([place])   . Pulmonary embolism ([place])     PAST SURGICAL HISTORY: Past Surgical History:  Procedure Laterality Date  . WISDOM TOOTH EXTRACTION      FAMILY HISTORY Family History  Problem Relation Age of Onset  .  Diabetes Maternal Grandmother   . Hypertension Maternal Grandfather   As of August 2019 the patient's father is 69 years old.  The patient's mother is 73 years old.  He lives in [place].  She lives in [place].  The patient has 1 full brothers, 4/2 brothers and 2 half sisters.  She is not aware of any history of clotting or bleeding  disorders in the family including no history of strokes or heart attacks.  GYNECOLOGIC HISTORY:  Her periods have always been very irregular although in the last year they came around once a month, between 3 and 5 weeks apart.   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  After her boyfriend moved to this area she came to [place].  She is not currently working.  His name is  Psychologist, prison and probation services and he works in accounts payable locally.  At home it just the 2 of them and [name].  The patient's mother in law Isidor Holts  lives nearby                         ADVANCED DIRECTIVES:    HEALTH MAINTENANCE: Social History   Tobacco Use  . Smoking status: Never Smoker  . Smokeless tobacco: Never Used  Substance Use Topics  . Alcohol use: Never    Frequency: Never  . Drug use: Never     Colonoscopy:  PAP:  Bone density:   No Known Allergies  Current Outpatient Medications  Medication Sig Dispense Refill  . Blood Pressure KIT 1 Device by Does not apply route 2 (two) times daily. 1 each 0  . enoxaparin (LOVENOX) 100 MG/ML injection Inject 1 mL (100 mg total) into the skin daily. 90 mL 0  . ferrous sulfate 325 (65 FE) MG tablet Take 1 tablet (325 mg total) by mouth daily with breakfast. 90 tablet 0  . Prenatal Vit-Fe Fumarate-FA (PRENATAL MULTIVITAMIN) TABS tablet Take 1 tablet by mouth daily at 12 noon.     No current facility-administered medications for this visit.     OBJECTIVE: Young African-American woman, in no acute distress; examined with chaperone in room  Vitals:   02/25/18 1510  BP: 111/70  Pulse: (!) 56  Resp: 18  Temp: 98.4 F (36.9 C)  SpO2: 100%     Body mass index is 23.84 kg/m.   Wt Readings from Last 3 Encounters:  02/25/18 134 lb 9.6 oz  (61.1 kg)  02/05/18 141 lb 4.8 oz (64.1 kg)  02/03/18 147 lb (66.7 kg)      ECOG FS:0 - Asymptomatic  Ocular: Sclerae unicteric, pupils round and equal Lungs no rales or rhonchi Heart regular rate and rhythm Abd soft, nontender, positive bowel sounds MSK no focal spinal tenderness, right lower extremity swelling has resolved Neuro: non-focal, well-oriented, appropriate affect Breasts: Deferred   LAB RESULTS:  CMP     Component Value Date/Time   NA 137 02/06/2018 0535   K 3.2 (L) 02/06/2018 0535   CL 107 02/06/2018 0535   CO2 20 (L) 02/06/2018 0535   GLUCOSE 93 02/06/2018 0535   BUN 10 02/06/2018 0535   CREATININE 0.84 02/06/2018 0535   CALCIUM 7.7 (L) 02/06/2018 0535   PROT 6.0 (L) 02/06/2018 7425  ALBUMIN 3.0 (L) 02/06/2018 0535   AST 30 02/06/2018 0535   ALT 26 02/06/2018 0535   ALKPHOS 154 (H) 02/06/2018 0535   BILITOT 0.7 02/06/2018 0535   GFRNONAA >60 02/06/2018 0535   GFRAA >60 02/06/2018 0535    No results found for: TOTALPROTELP, ALBUMINELP, A1GS, A2GS, BETS, BETA2SER, GAMS, MSPIKE, SPEI  No results found for: KPAFRELGTCHN, LAMBDASER, [facility]  Lab Results  Component Value Date   WBC 8.5 02/06/2018   NEUTROABS 4.8 02/06/2018   HGB 11.9 (L) 02/06/2018   HCT 36.2 02/06/2018   MCV 85.0 02/06/2018   PLT 243 02/06/2018    _0 @  No results found for: LABCA2  No components found for: AOZHYQ657  No results for input(s): INR in the last 168 hours.  No results found for: LABCA2  No results found for: QIO962  No results found for: XBM841  No results found for: LKG401  No results found for: CA2729  No components found for: HGQUANT  No results found for: CEA1 / No results found for: CEA1   No results found for: AFPTUMOR  No results found for: CHROMOGRNA  No results found for: PSA1  No visits with results within 3 Day(s) from this visit.  Latest known visit with results is:  Admission on 02/05/2018, Discharged on 02/07/2018    Component Date Value Ref Range Status  . WBC 02/05/2018 8.1  4.0 - 10.5 K/uL Final  . RBC 02/05/2018 4.27  3.87 - 5.11 MIL/uL Final  . Hemoglobin 02/05/2018 12.0  12.0 - 15.0 g/dL Final  . HCT 02/05/2018 36.7  36.0 - 46.0 % Final  . MCV 02/05/2018 85.9  78.0 - 100.0 fL Final  . MCH 02/05/2018 28.1  26.0 - 34.0 pg Final  . MCHC 02/05/2018 32.7  30.0 - 36.0 g/dL Final  . RDW 02/05/2018 13.9  11.5 - 15.5 % Final  . Platelets 02/05/2018 216  150 - 400 K/uL Final  . Neutrophils Relative % 02/05/2018 58  % Final  . Neutro Abs 02/05/2018 4.7  1.7 - 7.7 K/uL Final  . Lymphocytes Relative 02/05/2018 34  % Final  . Lymphs Abs 02/05/2018 2.8  0.7 - 4.0 K/uL Final  . Monocytes Relative 02/05/2018 6  % Final  . Monocytes Absolute 02/05/2018 0.5  0.1 - 1.0 K/uL Final  . Eosinophils Relative 02/05/2018 2  % Final  . Eosinophils Absolute 02/05/2018 0.2  0.0 - 0.7 K/uL Final  . Basophils Relative 02/05/2018 0  % Final  . Basophils Absolute 02/05/2018 0.0  0.0 - 0.1 K/uL Final   Performed at [facility], [address] [place], [place] [address]  . Sodium 02/05/2018 136  135 - 145 mmol/L Final  . Potassium 02/05/2018 3.8  3.5 - 5.1 mmol/L Final  . Chloride 02/05/2018 105  98 - 111 mmol/L Final  . CO2 02/05/2018 22  22 - 32 mmol/L Final  . Glucose, Bld 02/05/2018 100* 70 - 99 mg/dL Final  . BUN 02/05/2018 14  6 - 20 mg/dL Final  . Creatinine, Ser 02/05/2018 0.82  0.44 - 1.00 mg/dL Final  . Calcium 02/05/2018 9.0  8.9 - 10.3 mg/dL Final  . Total Protein 02/05/2018 6.7  6.5 - 8.1 g/dL Final  . Albumin 02/05/2018 3.4* 3.5 - 5.0 g/dL Final  . AST 02/05/2018 33  15 - 41 U/L Final  . ALT 02/05/2018 32  0 - 44 U/L Final  . Alkaline Phosphatase 02/05/2018 176* 38 - 126 U/L Final  . Total Bilirubin 02/05/2018 0.7  0.3 - 1.2 mg/dL Final  . GFR calc non Af Amer 02/05/2018 >60  >60 mL/min Final  . GFR calc Af Amer 02/05/2018 >60  >60 mL/min Final   Comment: (NOTE) The eGFR has been calculated using  the CKD EPI equation. This calculation has not been validated in all clinical situations. eGFR's persistently <60 mL/min signify possible Chronic Kidney Disease.   Georgiann Hahn gap 02/05/2018 9  5 - 15 Final   Performed at [facility], [address] [place], [place] [address]  . Uric Acid, Serum 02/05/2018 7.1  2.5 - 7.1 mg/dL Final   Performed at [facility], [address] [place], [place] [address]  . Creatinine, Urine 02/05/2018 67.00  mg/dL Final  . Total Protein, Urine 02/05/2018 6  mg/dL Final   NO NORMAL RANGE ESTABLISHED FOR THIS TEST  . Protein Creatinine Ratio 02/05/2018 0.09  0.00 - 0.15 mg/mg[Cre] Final   Performed at [facility], [address] [place], [place] [address]  . LDH 02/05/2018 221* 98 - 192 U/L Final   Performed at [facility], [address], [place], [place] [address]  . Color, Urine 02/05/2018 YELLOW  YELLOW Final  . APPearance 02/05/2018 CLEAR  CLEAR Final  . Specific Gravity, Urine 02/05/2018 1.009  1.005 - 1.030 Final  . pH 02/05/2018 6.0  5.0 - 8.0 Final  . Glucose, UA 02/05/2018 NEGATIVE  NEGATIVE mg/dL Final  . Hgb urine dipstick 02/05/2018 LARGE* NEGATIVE Final  . Bilirubin Urine 02/05/2018 NEGATIVE  NEGATIVE Final  . Ketones, ur 02/05/2018 NEGATIVE  NEGATIVE mg/dL Final  . Protein, ur 02/05/2018 NEGATIVE  NEGATIVE mg/dL Final  . Nitrite 02/05/2018 NEGATIVE  NEGATIVE Final  . Leukocytes, UA 02/05/2018 LARGE* NEGATIVE Final  . RBC / HPF 02/05/2018 11-20  0 - 5 RBC/hpf Final  . WBC, UA 02/05/2018 21-50  0 - 5 WBC/hpf Final  . Bacteria, UA 02/05/2018 NONE SEEN  NONE SEEN Final  . Squamous Epithelial / LPF 02/05/2018 0-5  0 - 5 Final  . Mucus 02/05/2018 PRESENT   Final   Performed at [facility], [address] [place], [place] [address]  . Sodium 02/06/2018 137  135 - 145 mmol/L Final  . Potassium 02/06/2018 3.2* 3.5 - 5.1 mmol/L Final  . Chloride 02/06/2018 107  98 - 111 mmol/L Final  . CO2 02/06/2018 20* 22 - 32 mmol/L  Final  . Glucose, Bld 02/06/2018 93  70 - 99 mg/dL Final  . BUN 02/06/2018 10  6 - 20 mg/dL Final  . Creatinine, Ser 02/06/2018 0.84  0.44 - 1.00 mg/dL Final  . Calcium 02/06/2018 7.7* 8.9 - 10.3 mg/dL Final  . Total Protein 02/06/2018 6.0* 6.5 - 8.1 g/dL Final  . Albumin 02/06/2018 3.0* 3.5 - 5.0 g/dL Final  . AST 02/06/2018 30  15 - 41 U/L Final  . ALT 02/06/2018 26  0 - 44 U/L Final  . Alkaline Phosphatase 02/06/2018 154* 38 - 126 U/L Final  . Total Bilirubin 02/06/2018 0.7  0.3 - 1.2 mg/dL Final  . GFR calc non Af Amer 02/06/2018 >60  >60 mL/min Final  . GFR calc Af Amer 02/06/2018 >60  >60 mL/min Final   Comment: (NOTE) The eGFR has been calculated using the CKD EPI equation. This calculation has not been validated in all clinical situations. eGFR's persistently <60 mL/min signify possible Chronic Kidney Disease.   Georgiann Hahn gap 02/06/2018 10  5 - 15 Final   Performed at [facility], [place],  [place] [address]  . WBC 02/06/2018 8.5  4.0 - 10.5 K/uL Final  . RBC 02/06/2018 4.26  3.87 - 5.11 MIL/uL Final  . Hemoglobin 02/06/2018 11.9* 12.0 - 15.0 g/dL Final  . HCT 02/06/2018 36.2  36.0 - 46.0 % Final  . MCV 02/06/2018 85.0  78.0 - 100.0 fL Final  . MCH 02/06/2018 27.9  26.0 - 34.0 pg Final  . MCHC 02/06/2018 32.9  30.0 - 36.0 g/dL Final  . RDW 02/06/2018 14.0  11.5 - 15.5 % Final  . Platelets 02/06/2018 243  150 - 400 K/uL Final  . Neutrophils Relative % 02/06/2018 57  % Final  . Neutro Abs 02/06/2018 4.8  1.7 - 7.7 K/uL Final  . Lymphocytes Relative 02/06/2018 37  % Final  . Lymphs Abs 02/06/2018 3.2  0.7 - 4.0 K/uL Final  . Monocytes Relative 02/06/2018 5  % Final  . Monocytes Absolute 02/06/2018 0.4  0.1 - 1.0 K/uL Final  . Eosinophils Relative 02/06/2018 1  % Final  . Eosinophils Absolute 02/06/2018 0.1  0.0 - 0.7 K/uL Final  . Basophils Relative 02/06/2018 0  % Final  . Basophils Absolute 02/06/2018 0.0  0.0 - 0.1 K/uL Final   Performed at [facility], [address] [place],  [address]  . Magnesium 02/06/2018 5.1* 1.7 - 2.4 mg/dL Final   Performed at [facility], [address] [place],  [address]    (this displays the last labs from the last 3 days)  No results found for: TOTALPROTELP, ALBUMINELP, A1GS, A2GS, BETS, BETA2SER, GAMS, MSPIKE, SPEI (this displays SPEP labs)  No results found for: KPAFRELGTCHN, LAMBDASER, KAPLAMBRATIO (kappa/lambda light chains)  No results found for: HGBA, HGBA2QUANT, HGBFQUANT, HGBSQUAN (Hemoglobinopathy evaluation)   Lab Results  Component Value Date   LDH 221 (H) 02/05/2018    No results found for: IRON, TIBC, IRONPCTSAT (Iron and TIBC)  Lab Results  Component Value Date   FERRITIN 21 02/04/2018    Urinalysis    Component Value Date/Time   COLORURINE YELLOW 02/05/2018 [place] 02/05/2018 1837   LABSPEC 1.009 02/05/2018 1837   PHURINE 6.0 02/05/2018 1837   GLUCOSEU NEGATIVE 02/05/2018 1837   HGBUR LARGE (A) 02/05/2018 1837   BILIRUBINUR NEGATIVE 02/05/2018 1837   KETONESUR NEGATIVE 02/05/2018 1837   PROTEINUR NEGATIVE 02/05/2018 1837   NITRITE NEGATIVE 02/05/2018 1837   LEUKOCYTESUR LARGE (A) 02/05/2018 1837     STUDIES: Ct Angio Head W Or Wo Contrast  Result Date: 02/05/2018 CLINICAL DATA:  22 y/o  F; headache and elevated blood pressure. EXAM: CT ANGIOGRAPHY HEAD TECHNIQUE: Multidetector CT imaging of the head was performed using the standard protocol during bolus administration of intravenous contrast. Multiplanar CT image reconstructions and MIPs were obtained to evaluate the vascular anatomy. CONTRAST:  146m ISOVUE-370 IOPAMIDOL (ISOVUE-370) INJECTION 76% COMPARISON:  None. FINDINGS: CT HEAD Brain: No evidence of acute infarction, hemorrhage, hydrocephalus, extra-axial collection or mass lesion/mass effect. Vascular: No hyperdense vessel or unexpected calcification. Skull: Normal. Negative for fracture or focal lesion. Sinuses:  Imaged portions are clear. Orbits: No acute finding. CTA HEAD Anterior circulation: No significant stenosis, proximal occlusion, aneurysm, or vascular malformation. Posterior circulation: No significant stenosis, proximal occlusion, aneurysm, or vascular malformation. Venous sinuses: As permitted by contrast timing, patent. Anatomic variants: Complete circle-of-Willis. Delayed phase: No abnormal intracranial enhancement. IMPRESSION: Normal CTA of the head. Electronically Signed   By: LKristine GarbeM.D.   On: 02/05/2018 21:06   Dg Chest 2 View  Result  Date: 02/03/2018 CLINICAL DATA:  Initial evaluation for acute chest pain. EXAM: CHEST - 2 VIEW COMPARISON:  None. FINDINGS: The cardiac and mediastinal silhouettes are within normal limits. The lungs are normally inflated. No airspace consolidation, pleural effusion, or pulmonary edema is identified. There is no pneumothorax. No acute osseous abnormality identified. IMPRESSION: No active cardiopulmonary disease. Electronically Signed   By: Jeannine Boga M.D.   On: 02/03/2018 06:59   Ct Angio Chest Pe W And/or Wo Contrast  Result Date: 02/03/2018 CLINICAL DATA:  Intermediate probability for pulmonary embolism. Chest pain starting 2 hours ago. Positive D-dimer. Recent delivery EXAM: CT ANGIOGRAPHY CHEST WITH CONTRAST TECHNIQUE: Multidetector CT imaging of the chest was performed using the standard protocol during bolus administration of intravenous contrast. Multiplanar CT image reconstructions and MIPs were obtained to evaluate the vascular anatomy. CONTRAST:  13m ISOVUE-370 IOPAMIDOL (ISOVUE-370) INJECTION 76% COMPARISON:  None. FINDINGS: Cardiovascular: There is a calcified nodular density directly central to a left lower lobe subsegmental vessel at the level of the lateral basal segment with a somewhat branching appearance. No other pulmonary artery filling defect is seen. Normal heart size. No pericardial effusion. Mediastinum/Nodes:  Negative for adenopathy or mass. Prominent thymus, likely physiologic. Lungs/Pleura: The central airways are clear. Small sub pleural ground-glass density immediately associated with the abnormal vessels described above. Elsewhere reticulation and interlobular septal thickening in the left lower lobe is attributed atelectasis given volume loss. Upper Abdomen: Negative Musculoskeletal: No acute or aggressive finding These results were called by telephone at the time of interpretation on 02/03/2018 at 11:34 am to Dr. AVarney Biles, who verbally acknowledged these results. No history of vertebroplasty. Review of the MIP images confirms the above findings. IMPRESSION: 1. Subsegmental calcified pulmonary embolism in the left lower lobe. Calcification favors a chronic process but associated subpleural ground-glass density and history is more indicative of an acute embolism. Suggest venous imaging to exclude a calcified thrombus. 2. Mild left lower lobe atelectasis superimposed on the above. Electronically Signed   By: JMonte FantasiaM.D.   On: 02/03/2018 11:36   UKoreaAbdomen Limited Ruq  Result Date: 02/03/2018 CLINICAL DATA:  Abdominal pain. EXAM: ULTRASOUND ABDOMEN LIMITED RIGHT UPPER QUADRANT COMPARISON:  None. FINDINGS: Gallbladder: No gallstones or wall thickening visualized. No sonographic Murphy sign noted by sonographer. Common bile duct: Diameter: 1.7 mm Liver: No focal lesion identified. Within normal limits in parenchymal echogenicity. Portal vein is patent on color Doppler imaging with normal direction of blood flow towards the liver. IMPRESSION: Normal right upper quadrant ultrasound. Electronically Signed   By: HKathreen Devoid  On: 02/03/2018 08:56    ELIGIBLE FOR AVAILABLE RESEARCH PROTOCOL: no  ASSESSMENT: 22y.o.  [place] 02/03/2018 with a left lower extremity pulmonary embolus 5 days postpartum  (1) treated initially with intravenous heparin, transition to Lovenox as  of  (2) hyper coagulable panel obtained 02/03/2018 shows no evidence of congenital or acquired coagulopathy  PLAN: SDamicais having no problems with the Lovenox, and specifically no bleeding complications and no unusual pain.  She is very compliant with treatment and apparently has not missed any.  The right lower extremity swelling has resolved.  She has no pulmonary symptoms at present  The plan is to continue Lovenox for 3 months.  I will then check a d-dimer and repeat her Doppler and CT angios.  If the clot has resolved and the d-dimer is favorable she will stop anticoagulation mid November.  We will then repeat a d-dimer in  December and assuming that is normal we will discontinue follow-up then.  We discussed contraception issues and she is considering a copper IUD.  We discussed things that might make her clot abnormally including prolonged immobilization such as in a long trip or surgery or any kind of trauma in addition of course to estrogens.  She knows to call for any other issues that may develop before the next visit.

## 2018-02-25 NOTE — Telephone Encounter (Signed)
Patient called requesting f/u with GM. Per patient seen by GM at [facility] and needs f/u for results. Patient not seen in office before and was scheduled for 9/6 by way of schedule message (no show). This message has been routed to GM/desk nurse/new patient scheduler. GM has has no upcoming availability.

## 2018-02-25 NOTE — Telephone Encounter (Signed)
Gave avs and calendar ° °

## 2018-02-25 NOTE — Telephone Encounter (Signed)
This RN reviewed chart and noted follow up per 8/19 inpt consult per postpartum lower extremity PE- pt now on lovenox.  Noted opening today and tomorrow - this RN called pt and discussed-she will come in today at 3pm .

## 2018-03-13 ENCOUNTER — Emergency Department (HOSPITAL_COMMUNITY): Payer: Medicaid Other

## 2018-03-13 ENCOUNTER — Telehealth: Payer: Self-pay

## 2018-03-13 ENCOUNTER — Encounter (HOSPITAL_COMMUNITY): Payer: Self-pay | Admitting: Emergency Medicine

## 2018-03-13 ENCOUNTER — Emergency Department (HOSPITAL_COMMUNITY)
Admission: EM | Admit: 2018-03-13 | Discharge: 2018-03-14 | Disposition: A | Discharge status: Home / Self Care | Payer: Medicaid Other | Attending: Emergency Medicine | Admitting: Emergency Medicine

## 2018-03-13 ENCOUNTER — Other Ambulatory Visit: Payer: Self-pay

## 2018-03-13 DIAGNOSIS — R51 Headache: Secondary | ICD-10-CM | POA: Insufficient documentation

## 2018-03-13 DIAGNOSIS — H538 Other visual disturbances: Secondary | ICD-10-CM | POA: Insufficient documentation

## 2018-03-13 DIAGNOSIS — R079 Chest pain, unspecified: Secondary | ICD-10-CM | POA: Diagnosis present

## 2018-03-13 DIAGNOSIS — R42 Dizziness and giddiness: Secondary | ICD-10-CM | POA: Insufficient documentation

## 2018-03-13 DIAGNOSIS — R0789 Other chest pain: Secondary | ICD-10-CM | POA: Diagnosis not present

## 2018-03-13 DIAGNOSIS — R0600 Dyspnea, unspecified: Secondary | ICD-10-CM | POA: Insufficient documentation

## 2018-03-13 DIAGNOSIS — Z79899 Other long term (current) drug therapy: Secondary | ICD-10-CM | POA: Insufficient documentation

## 2018-03-13 DIAGNOSIS — H66003 Acute suppurative otitis media without spontaneous rupture of ear drum, bilateral: Secondary | ICD-10-CM | POA: Insufficient documentation

## 2018-03-13 LAB — I-STAT BETA HCG BLOOD, ED (MC, WL, AP ONLY): I-stat hCG, quantitative: <5 m[IU]/mL (ref <5)

## 2018-03-13 LAB — CBC
HEMATOCRIT: 41.2 % (ref 36.0–46.0)
Hemoglobin: 12.8 g/dL (ref 12.0–15.0)
MCH: 27.2 pg (ref 26.0–34.0)
MCHC: 31.1 g/dL (ref 30.0–36.0)
MCV: 87.5 fL (ref 78.0–100.0)
Platelets: 160 10*3/uL (ref 150–400)
RBC: 4.71 MIL/uL (ref 3.87–5.11)
RDW: 13.6 % (ref 11.5–15.5)
WBC: 7.9 10*3/uL (ref 4.0–10.5)

## 2018-03-13 LAB — BASIC METABOLIC PANEL
Anion gap: 7 (ref 5–15)
BUN: 10 mg/dL (ref 6–20)
CALCIUM: 9.9 mg/dL (ref 8.9–10.3)
CO2: 27 mmol/L (ref 22–32)
Chloride: 105 mmol/L (ref 98–111)
Creatinine, Ser: 0.8 mg/dL (ref 0.44–1.00)
GFR calc non Af Amer: >60 mL/min (ref >60)
Glucose, Bld: 89 mg/dL (ref 70–99)
POTASSIUM: 4.1 mmol/L (ref 3.5–5.1)
Sodium: 139 mmol/L (ref 135–145)

## 2018-03-13 LAB — I-STAT TROPONIN, ED: Troponin i, poc: 0 ng/mL (ref 0.00–0.08)

## 2018-03-13 LAB — D-DIMER, QUANTITATIVE: D-Dimer, Quant: <0.27 ug/mL-FEU (ref 0.00–0.50)

## 2018-03-13 NOTE — Telephone Encounter (Signed)
Received call from pt stating that she is feeling some intermittent chest pain, headaches, blurry vision and low blood pressure 82/55. Pt just had a baby and had hx of PE not too long ago. Pt wanted to know if she needs to come today to get check out. Advised that pt go to the ED to get further evaluation, since she has a hx of PE and concern that if she has a clot, it could travel to her brain.Pt was asking if she should call her PCP first. Told pt to not wait and go to the ED.   Pt verbalized understanding and will follow advise. Will follow up with pt tomorrow to see how she is doing.

## 2018-03-13 NOTE — ED Provider Notes (Signed)
Patient placed in Quick Look pathway, seen and evaluated   Chief Complaint: chest pain  HPI:  22 y.o. female with history of PE (6 weeks ago) currently on lovenox who presents emergency department today for chest pain.  Patient reports she awoke this morning with left sided chest pain.  She reports the pain was dull, achy in nature was associate with mild shortness of breath.  She notes it is worse with deep breathing.  Pain was nonexertional in nature and was associate with nausea, diaphoresis or emesis.  She denies any fever, cough.  States this is less severe than the PE she has had in her past.  She is compliant with her Lovenox medication.  She thought this may be due to carrying her child as he is now 12 pounds.  ROS: chest pain and sob (one)  Physical Exam:   Gen: No distress  Neuro: Awake and Alert  Skin: Warm    Focused Exam: Mild tenderness over the chest wall. Heart RRR. No murmurs. Lungs CTA b/l. No lower extremity edema.    Initiation of care has begun. The patient has been counseled on the process, plan, and necessity for staying for the completion/evaluation, and the remainder of the medical screening examination    Princella Pellegrini 03/13/18 Johnnye Sima, MD 03/15/18 [phone]

## 2018-03-13 NOTE — ED Triage Notes (Signed)
Patient reports intermittent mid/left chest pain onset this morning with mild SOB , no nausea or diaphoresis , pt. stated history of pulmonary embolism .

## 2018-03-14 ENCOUNTER — Telehealth: Payer: Self-pay

## 2018-03-14 MED ORDER — CEFDINIR 300 MG PO CAPS: 300.0000 mg | ORAL_CAPSULE | Freq: Two times a day (BID) | ORAL | 0 refills | Status: AC

## 2018-03-14 NOTE — Discharge Instructions (Signed)
Thank you for allowing me to care for you today in the Emergency Department.   Please call tomorrow to reschedule your follow-up appointment with your OB/GYN.  When you follow-up, please have them recheck your ears to make sure your infection is improving.  You should also let them know if you continue to have headaches, dizziness, feeling your pulse in your ear, or intermittent blurred vision.  Treat your ear infection, take 1 tablet of Ceftin ear 2 times daily.  This antibiotic is safe to use while you are breast-feeding.  This course is for 10 days.  You should take the course for 10 days even if your symptoms start to improve before that time.  Your d-dimer today was negative.  Continue taking her Lovenox as recommended by your hematologist.   Your chest pain seem to be worse with pushing, pulling, and moving your arm.  You can apply ice for 15 to 20 minutes to any areas that are sore.  You can also try taking Tylenol or ibuprofen to see if this improves your pain.  Return to the emergency department if you develop severe shortness of breath, double vision, loss of vision in one or both eyes, numbness or weakness on one side of the body, high fever greater than 100.5, or other new, concerning symptoms.

## 2018-03-14 NOTE — ED Notes (Signed)
Reviewed d/c instructions with pt, who verbalized understanding and had no outstanding questions. Pt departed in NAD, refused use of wheelchair.   

## 2018-03-14 NOTE — Telephone Encounter (Signed)
Spoke with pt by phone regarding her recent ED visit. Pt states she has not yet started her antibiotic but plans to start today.   Pt instructed if CP persist or gets worse she needs to return to the emergency room   Pt verbalizes understanding of instructions.

## 2018-03-14 NOTE — ED Provider Notes (Signed)
[facility] EMERGENCY DEPARTMENT Provider Note   CSN: [id number] Arrival date & time: 03/13/18  1902     History   Chief Complaint Chief Complaint  Patient presents with  . Chest Pain    HPI Diana Lowe is a 22 y.o. female with a history of anemia, primary thrombocytopenia, postpartum pulmonary embolism, and postpartum preeclampsia who presents to the emergency department with a chief complaint of chest pain.  The patient endorses left-sided chest pain that she characterizes as dull that began this morning.  She reports associated mild dyspnea.  She reports that she is currently taking Lovenox after she was diagnosed with a PE after her son was born, approximately 6 weeks ago.  She states that she may have missed 1 dose last week, but no recent missed doses.  She reports that when she was diagnosed with her PE that she was having pressure-like pain to the middle of the chest that radiates to the back, which she states is not happening at this time.  She reports that when she awoke with her symptoms that she called her hematologist who advised her to seek additional evaluation.  She also reports episodes of headaches, blurred vision, dizziness and lightheadedness where she feels like she may almost pass out is worse when she stands, and pulsating tinnitus.  She states these episodes symptoms began since she was discharged during her last visit.  She denies fever, chills, vomiting, abdominal pain, cough, sore throat, nasal congestion, rash, numbness, or weakness.  She reports that she did receive an epidural prior to delivering her child 6 weeks ago.  She reports that she has been trying to hydrate well since she is breast-feeding.  The history is provided by the patient. No language interpreter was used.    Past Medical History:  Diagnosis Date  . Anemia   . Primary thrombocytopenia ([place])   . Pulmonary embolism [facility])     Patient Active Problem List   Diagnosis Date Noted  . Preeclampsia in postpartum period 02/05/2018  . Preeclampsia 02/03/2018  . Pulmonary embolism, blood-clot, obstetric, postpartum condition 02/03/2018  . Pulmonary embolism during puerperium 02/03/2018    Past Surgical History:  Procedure Laterality Date  . WISDOM TOOTH EXTRACTION       OB History    Gravida  1   Para  1   Term  1   Preterm  0   AB  0   Living  1     SAB  0   TAB  0   Ectopic  0   Multiple  0   Live Births  1            Home Medications    Prior to Admission medications   Medication Sig Start Date End Date Taking? Authorizing Provider  Blood Pressure KIT 1 Device by Does not apply route 2 (two) times daily. 02/07/18   Noralyn Pick, FNP  cefdinir (OMNICEF) 300 MG capsule Take 1 capsule (300 mg total) by mouth 2 (two) times daily for 10 days. 03/14/18 03/24/18  Bailynn Dyk A, PA-C  enoxaparin (LOVENOX) 100 MG/ML injection Inject 1 mL (100 mg total) into the skin daily. 02/25/18 05/26/18  Magrinat, Virgie Dad, MD  ferrous sulfate 325 (65 FE) MG tablet Take 1 tablet (325 mg total) by mouth daily with breakfast. 02/02/18   Marikay Alar, CNM  Prenatal Vit-Fe Fumarate-FA (PRENATAL MULTIVITAMIN) TABS tablet Take 1 tablet by mouth daily at 12 noon.  [provider]    Family History Family History  Problem Relation Age of Onset  . Diabetes Maternal Grandmother   . Hypertension Maternal Grandfather     Social History Social History   Tobacco Use  . Smoking status: Never Smoker  . Smokeless tobacco: Never Used  Substance Use Topics  . Alcohol use: Never    Frequency: Never  . Drug use: Never     Allergies   Patient has no known allergies.   Review of Systems Review of Systems  Constitutional: Negative for activity change, chills and fever.  HENT: Positive for tinnitus. Negative for congestion and facial swelling.   Respiratory: Positive for shortness of breath. Negative for cough.   Cardiovascular:  Negative for chest pain.  Gastrointestinal: Positive for nausea. Negative for abdominal pain, blood in stool, diarrhea and vomiting.  Genitourinary: Negative for dysuria.  Musculoskeletal: Negative for back pain.  Skin: Negative for rash.  Allergic/Immunologic: Negative for immunocompromised state.  Neurological: Positive for dizziness, light-headedness and headaches. Negative for syncope, weakness and numbness.  Psychiatric/Behavioral: Negative for confusion.     Physical Exam Updated Vital Signs BP 104/67   Pulse 62   Temp 97.7 F (36.5 C) (Oral)   Resp 15   SpO2 99%   Physical Exam  Constitutional: She is oriented to person, place, and time. No distress.  HENT:  Head: Normocephalic.  Eyes: Pupils are equal, round, and reactive to light. Conjunctivae and EOM are normal.  Neck: Neck supple.  Cardiovascular: Normal rate, regular rhythm, normal heart sounds and intact distal pulses. Exam reveals no gallop and no friction rub.  No murmur heard. Pulmonary/Chest: Effort normal and breath sounds normal. No stridor. No respiratory distress. She has no wheezes. She has no rales. She exhibits tenderness.  Pain is worse with pushing and pulling motions which engage the pectoralis muscles of the chest wall.  Increased pain with movement of the left upper extremity.  No rashes, erythema, edema, or warmth.  Abdominal: Soft. She exhibits no distension and no mass. There is no tenderness. There is no rebound and no guarding. No hernia.  Musculoskeletal: She exhibits no edema, tenderness or deformity.  Neurological: She is alert and oriented to person, place, and time.  Cranial nerves II through XII are grossly intact.  Moves all 4 extremities.  GCS 15.  No pronator drift.  Negative Romberg.  Finger-to-nose is intact bilaterally.  Normal heel-to-shin bilaterally.  Normal rapid alternating movements.  Speaks in complete, fluent sentences.  Skin: Skin is warm. No rash noted. She is not diaphoretic.    Psychiatric: Her behavior is normal.  Nursing note and vitals reviewed.    ED Treatments / Results  Labs (all labs ordered are listed, but only abnormal results are displayed) Labs Reviewed  BASIC METABOLIC PANEL  CBC  D-DIMER, QUANTITATIVE (NOT AT [facility])  I-STAT TROPONIN, ED  I-STAT BETA HCG BLOOD, ED ([facility], [facility], [facility] ONLY)    EKG None  Radiology Dg Chest 2 View  Result Date: 03/13/2018 CLINICAL DATA:  Intermittent mid left chest pain since this morning. Mild shortness of breath. EXAM: CHEST - 2 VIEW COMPARISON:  02/03/2018 and chest CTA dated 02/03/2018. FINDINGS: The heart size and mediastinal contours are within normal limits. Both lungs are clear. The visualized skeletal structures are unremarkable. IMPRESSION: Normal examination. Electronically Signed   By: Claudie Revering M.D.   On: 03/13/2018 20:24    Procedures Procedures (including critical care time)  Medications Ordered in ED Medications - No data  to display   Initial Impression / Assessment and Plan / ED Course  I have reviewed the triage vital signs and the nursing notes.  Pertinent labs & imaging results that were available during my care of the patient were reviewed by me and considered in my medical decision making (see chart for details).     22 year old female with a history of primary thrombocytopenia, anemia, postpartum PE, and postpartum preeclampsia presenting with chest pain and dyspnea, onset this morning.  She also reports recurrent episodes of dizziness, headache, nausea, blurred vision, and a pulsatory tinnitus.  Chest x-ray is normal.  Troponin is normal.  EKG with no acute changes from previous.  Hemoglobin is normal.  Labs are otherwise reassuring.  D-dimer is also negative.  The patient was discussed with Dr. Lanny Cramp, attending physician.  Given negative d-dimer and the patient being on Lovenox very low suspicion for PE.  On exam, she does have reproducible pain with engagement of the pectoralis  muscles of the left chest wall.  Given that she has a newborn for the last 6 weeks, suspect worsening pain today may be musculoskeletal in nature.  Regarding her episodes of dizziness, headache, blurred vision.  Doubt pseudotumor cerebri.  She did recently have an epidural.  However on exam, bilateral TMs appear infectious.  Will treat with Ceftin ear since she is currently breast-feeding.  Recommended follow-up for recheck of her ears with her OB/GYN.  Strict return precautions given.  The patient is hemodynamically stable and in no acute distress.  She is safe for discharge home with outpatient follow-up at this time.  Final Clinical Impressions(s) / ED Diagnoses   Final diagnoses:  Chest wall pain  Non-recurrent acute suppurative otitis media of both ears without spontaneous rupture of tympanic membranes    ED Discharge Orders         Ordered    cefdinir (OMNICEF) 300 MG capsule  2 times daily     03/14/18 0048           Loyola Santino, Maree Erie A, PA-C 03/14/18 0900    Veryl Speak, MD 03/17/18 [id number]

## 2018-03-14 NOTE — ED Notes (Signed)
Breast pump and kit at bedside.

## 2018-04-22 ENCOUNTER — Inpatient Hospital Stay: Payer: Medicaid Other | Attending: Oncology

## 2018-04-22 DIAGNOSIS — O1490 Unspecified pre-eclampsia, unspecified trimester: Secondary | ICD-10-CM

## 2018-04-22 DIAGNOSIS — I2699 Other pulmonary embolism without acute cor pulmonale: Secondary | ICD-10-CM | POA: Diagnosis not present

## 2018-04-22 DIAGNOSIS — O8823 Thromboembolism in the puerperium: Secondary | ICD-10-CM

## 2018-04-22 LAB — COMPREHENSIVE METABOLIC PANEL
ALBUMIN: 4.1 g/dL (ref 3.5–5.0)
ALT: 124 U/L — AB (ref 0–44)
ANION GAP: 7 (ref 5–15)
AST: 54 U/L — AB (ref 15–41)
Alkaline Phosphatase: 186 U/L — ABNORMAL HIGH (ref 38–126)
BUN: 9 mg/dL (ref 6–20)
CHLORIDE: 107 mmol/L (ref 98–111)
CO2: 28 mmol/L (ref 22–32)
Calcium: 9.7 mg/dL (ref 8.9–10.3)
Creatinine, Ser: 0.83 mg/dL (ref 0.44–1.00)
GFR calc Af Amer: >60 mL/min (ref >60)
GLUCOSE: 67 mg/dL — AB (ref 70–99)
POTASSIUM: 4 mmol/L (ref 3.5–5.1)
SODIUM: 142 mmol/L (ref 135–145)
TOTAL PROTEIN: 7.2 g/dL (ref 6.5–8.1)
Total Bilirubin: <0.2 mg/dL — ABNORMAL LOW (ref 0.3–1.2)

## 2018-04-22 LAB — CBC WITH DIFFERENTIAL/PLATELET
Abs Immature Granulocytes: 0.01 10*3/uL (ref 0.00–0.07)
BASOS ABS: 0 10*3/uL (ref 0.0–0.1)
BASOS PCT: 0 %
Eosinophils Absolute: 0.3 10*3/uL (ref 0.0–0.5)
Eosinophils Relative: 4 %
HCT: 39 % (ref 36.0–46.0)
Hemoglobin: 12.3 g/dL (ref 12.0–15.0)
IMMATURE GRANULOCYTES: 0 %
Lymphocytes Relative: 51 %
Lymphs Abs: 3.2 10*3/uL (ref 0.7–4.0)
MCH: 27.1 pg (ref 26.0–34.0)
MCHC: 31.5 g/dL (ref 30.0–36.0)
MCV: 85.9 fL (ref 80.0–100.0)
MONOS PCT: 10 %
Monocytes Absolute: 0.7 10*3/uL (ref 0.1–1.0)
NEUTROS ABS: 2.2 10*3/uL (ref 1.7–7.7)
NEUTROS PCT: 35 %
NRBC: 0 % (ref 0.0–0.2)
PLATELETS: 173 10*3/uL (ref 150–400)
RBC: 4.54 MIL/uL (ref 3.87–5.11)
RDW: 14.1 % (ref 11.5–15.5)
WBC: 6.4 10*3/uL (ref 4.0–10.5)

## 2018-04-22 LAB — D-DIMER, QUANTITATIVE (NOT AT ARMC)

## 2018-04-24 ENCOUNTER — Other Ambulatory Visit: Payer: Self-pay | Admitting: Oncology

## 2018-04-28 ENCOUNTER — Ambulatory Visit (HOSPITAL_COMMUNITY)
Admission: RE | Admit: 2018-04-28 | Discharge: 2018-04-28 | Disposition: A | Discharge status: Home / Self Care | Payer: Medicaid Other | Source: Ambulatory Visit | Attending: Oncology | Admitting: Oncology

## 2018-04-28 ENCOUNTER — Ambulatory Visit (HOSPITAL_COMMUNITY): Payer: Medicaid Other

## 2018-04-28 ENCOUNTER — Encounter: Payer: Self-pay | Admitting: Oncology

## 2018-04-28 DIAGNOSIS — Z3A00 Weeks of gestation of pregnancy not specified: Secondary | ICD-10-CM | POA: Diagnosis not present

## 2018-04-28 DIAGNOSIS — O8823 Thromboembolism in the puerperium: Secondary | ICD-10-CM | POA: Diagnosis present

## 2018-04-28 DIAGNOSIS — I2693 Single subsegmental pulmonary embolism without acute cor pulmonale: Secondary | ICD-10-CM | POA: Insufficient documentation

## 2018-04-28 DIAGNOSIS — O1490 Unspecified pre-eclampsia, unspecified trimester: Secondary | ICD-10-CM

## 2018-04-28 MED ORDER — IOPAMIDOL (ISOVUE-370) INJECTION 76%
100.0000 mL | Freq: Once | INTRAVENOUS | Status: AC | PRN
  Administered 2018-04-28: 100 mL via INTRAVENOUS

## 2018-04-28 MED ORDER — SODIUM CHLORIDE (PF) 0.9 % IJ SOLN
INTRAMUSCULAR | Status: AC
  Filled 2018-04-28: qty 50

## 2018-04-28 MED ORDER — IOPAMIDOL (ISOVUE-370) INJECTION 76%
INTRAVENOUS | Status: AC
  Filled 2018-04-28: qty 100

## 2018-04-30 ENCOUNTER — Telehealth: Payer: Self-pay | Admitting: *Deleted

## 2018-04-30 MED ORDER — ENOXAPARIN SODIUM 100 MG/ML ~~LOC~~ SOLN: 1.5000 mg/kg | SUBCUTANEOUS | 0 refills | Status: DC

## 2018-04-30 NOTE — Telephone Encounter (Signed)
See note under My Chart message via encounter tab -  Pt left my chart message on evening 11/11 - stating concern due to left chest pain with arm numbness and concern per heart issues.  This RN called pt's cell number and obtained an identified VM- detailed message left informing pt to return call ASAP and request to speak to TRIAGE per her concern.  Per call to home number on demographic page - was informed " you have the wrong number "  In attempt in verify number - the call was d/ced by the receiver.  This RN noted CT was done same day as email for evaluation for PE per pt's clotting issues and on lovenox - scheduled per order from visit in Sept 2019 with Dr Darnelle CatalanMagrinat.  No other ER notes or visit noted

## 2018-04-30 NOTE — Telephone Encounter (Signed)
This RN spoke with pt per her return call- discussed concern she sent email regarding.  Presently pt states pain has improved and is wondering if pain was associated to " working out the day before and I haven't done that in a while "  This RN discussed above possible cause but also reviewed with pt s/s related to heart issues as well as noted history of PE and possible pain or SOB relating exertion above her normal routine.  This RN reviewed CT results showing decreased residual PE and need to continue the heparin until MD visit for further recommendations. Pt states she will need a refill on the lovenox. Scheduled appointment is 05/29/2018.  Marissa inquired " should I not exercise ?"  This RN informed pt exercise is appropriate but she needs to start slow and build up- as well as to not over exert herself- exercise does not aggravate known diagnosis and could be beneficial.  Post call this RN refilled lovenox for continued therapy.

## 2018-05-20 ENCOUNTER — Inpatient Hospital Stay: Payer: Medicaid Other | Attending: Oncology

## 2018-05-20 ENCOUNTER — Ambulatory Visit (HOSPITAL_COMMUNITY)
Admission: RE | Admit: 2018-05-20 | Discharge: 2018-05-20 | Disposition: A | Discharge status: Home / Self Care | Payer: Medicaid Other | Source: Ambulatory Visit | Attending: Oncology | Admitting: Oncology

## 2018-05-20 DIAGNOSIS — D6949 Other primary thrombocytopenia: Secondary | ICD-10-CM | POA: Insufficient documentation

## 2018-05-20 DIAGNOSIS — Z79899 Other long term (current) drug therapy: Secondary | ICD-10-CM | POA: Insufficient documentation

## 2018-05-20 DIAGNOSIS — Z7901 Long term (current) use of anticoagulants: Secondary | ICD-10-CM | POA: Insufficient documentation

## 2018-05-20 DIAGNOSIS — O8823 Thromboembolism in the puerperium: Secondary | ICD-10-CM | POA: Diagnosis present

## 2018-05-20 DIAGNOSIS — D649 Anemia, unspecified: Secondary | ICD-10-CM | POA: Diagnosis not present

## 2018-05-20 DIAGNOSIS — Z86711 Personal history of pulmonary embolism: Secondary | ICD-10-CM | POA: Insufficient documentation

## 2018-05-20 DIAGNOSIS — I10 Essential (primary) hypertension: Secondary | ICD-10-CM | POA: Diagnosis not present

## 2018-05-20 DIAGNOSIS — O1490 Unspecified pre-eclampsia, unspecified trimester: Secondary | ICD-10-CM | POA: Diagnosis not present

## 2018-05-20 LAB — CBC WITH DIFFERENTIAL/PLATELET
ABS IMMATURE GRANULOCYTES: 0 10*3/uL (ref 0.00–0.07)
BASOS ABS: 0 10*3/uL (ref 0.0–0.1)
Basophils Relative: 0 %
Eosinophils Absolute: 0.1 10*3/uL (ref 0.0–0.5)
Eosinophils Relative: 3 %
HCT: 37.1 % (ref 36.0–46.0)
Hemoglobin: 11.7 g/dL — ABNORMAL LOW (ref 12.0–15.0)
IMMATURE GRANULOCYTES: 0 %
LYMPHS PCT: 55 %
Lymphs Abs: 2.9 10*3/uL (ref 0.7–4.0)
MCH: 27 pg (ref 26.0–34.0)
MCHC: 31.5 g/dL (ref 30.0–36.0)
MCV: 85.7 fL (ref 80.0–100.0)
Monocytes Absolute: 0.4 10*3/uL (ref 0.1–1.0)
Monocytes Relative: 8 %
NEUTROS ABS: 1.8 10*3/uL (ref 1.7–7.7)
NRBC: 0 % (ref 0.0–0.2)
Neutrophils Relative %: 34 %
Platelets: 166 10*3/uL (ref 150–400)
RBC: 4.33 MIL/uL (ref 3.87–5.11)
RDW: 13.7 % (ref 11.5–15.5)
WBC: 5.2 10*3/uL (ref 4.0–10.5)

## 2018-05-20 LAB — COMPREHENSIVE METABOLIC PANEL
ALT: 186 U/L — ABNORMAL HIGH (ref 0–44)
AST: 72 U/L — ABNORMAL HIGH (ref 15–41)
Albumin: 4.1 g/dL (ref 3.5–5.0)
Alkaline Phosphatase: 150 U/L — ABNORMAL HIGH (ref 38–126)
Anion gap: 6 (ref 5–15)
BUN: 7 mg/dL (ref 6–20)
CHLORIDE: 108 mmol/L (ref 98–111)
CO2: 28 mmol/L (ref 22–32)
Calcium: 9.4 mg/dL (ref 8.9–10.3)
Creatinine, Ser: 0.87 mg/dL (ref 0.44–1.00)
GFR calc non Af Amer: >60 mL/min (ref >60)
Glucose, Bld: 89 mg/dL (ref 70–99)
POTASSIUM: 3.9 mmol/L (ref 3.5–5.1)
Sodium: 142 mmol/L (ref 135–145)
Total Bilirubin: 0.3 mg/dL (ref 0.3–1.2)
Total Protein: 6.8 g/dL (ref 6.5–8.1)

## 2018-05-20 LAB — D-DIMER, QUANTITATIVE (NOT AT ARMC)

## 2018-05-20 NOTE — Progress Notes (Signed)
Right lower extremity venous duplex has been completed. Negative for DVT. [facility]. Darrall DearsMagrinat's office.  05/20/18 11:20 AM Olen CordialGreg Lars Jeziorski RVT

## 2018-05-28 NOTE — Progress Notes (Signed)
[facility]  [phone]) [phone] Fax:(336) [phone]    ID: Diana Lowe DOB: 1995/08/19  MR#: [id number]  ZHY#:[id number]  Patient Care Team: Patient, No Pcp Per as PCP - General (General Practice) Crawford Givens, MD as Consulting Physician (Obstetrics and Gynecology) OTHER MD:  CHIEF COMPLAINT: Pulmonary embolism  CURRENT TREATMENT: Lovenox   HISTORY OF CURRENT ILLNESS: From the original intake note:  Diana Lowe is a 22 year old [place] woman currently day 5 from uncomplicated delivery of a baby boy 01/30/2018.  This morning, 02/03/2018, around 5 AM, she woke up with back pain.  This changed to a chest pressure sensation, not associated with shortness of breath, cough, or pleurisy.  This was not relieved by change in position or by trying reflux medications as was suggested by primary care.  Accordingly she presented to the emergency room approximately 6:30 in the morning where she was found to be hypertensive and to have some swelling of the left lower extremity.  Bilateral Doppler ultrasonography showed no evidence of DVT.  However a CT angiogram of the chest showed a calcified clot in the left lower lobe with associated subpleural changes.  The patient was started on intravenous heparin and we were consulted to evaluate for possible hypercoagulable state.  INTERVAL HISTORY: Diana Lowe returns today for follow-up of her pulmonary embolism. She is accompanied by her husband and infant.  The patient continues on Lovenox Injections, 100 mg/ml once per day. She is not bleeding or bruising. Her insurance covers all but $3. This will not change come Jan to her knowledge.   Since her last visit here, she went to the ED for chest pain on 03/13/2018. She received a chest DG showing The heart size and mediastinal contours are within normal limits. Both lungs are clear. The visualized skeletal structures are unremarkable.  On 04/28/2018, she underwent an  angiography chest CT with contrast, showing a slightly decreased size of calcified subsegmental pulmonary embolism in the left lower lobe. There is no evidence of acute pulmonary emboli or other acute abnormality in the chest.  On 05/20/2018 she underwent vascular ultrasound of the right lower extremity showing no evidence of deep vein thrombosis.   REVIEW OF SYSTEMS: Diana Lowe is doing well overall and she has a healthy baby. The patient denies unusual headaches, visual changes, nausea, vomiting, or dizziness. There has been no unusual cough, phlegm production, or pleurisy. This been no change in bowel or bladder habits. The patient denies unexplained fatigue or unexplained weight loss, bleeding, rash, or fever. A detailed review of systems was otherwise noncontributory.    PAST MEDICAL HISTORY: Past Medical History:  Diagnosis Date  . Anemia   . Primary thrombocytopenia ([place])   . Pulmonary embolism ([place])     PAST SURGICAL HISTORY: Past Surgical History:  Procedure Laterality Date  . WISDOM TOOTH EXTRACTION      FAMILY HISTORY Family History  Problem Relation Age of Onset  . Diabetes Maternal Grandmother   . Hypertension Maternal Grandfather   As of August 2019 the patient's father is 24 years old.  The patient's mother is 54 years old.  He lives in [place].  She lives in [place].  The patient has 1 full brothers, 4/2 brothers and 2 half sisters.  She is not aware of any history of clotting or bleeding disorders in the family including no history of strokes or heart attacks.  GYNECOLOGIC HISTORY:  Her periods have always been very irregular although in the last year  they came around once a month, between 3 and 5 weeks apart.   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   After her boyfriend moved to this area she came to [place].  She is not currently working.  His name is  Psychologist, prison and probation services and he works in accounts payable locally.  At home it just the 2 of them and their baby Diana Lowe.  The patient's mother in law Diana Lowe  lives nearby                         ADVANCED DIRECTIVES:    HEALTH MAINTENANCE: Social History   Tobacco Use  . Smoking status: Never Smoker  . Smokeless tobacco: Never Used  Substance Use Topics  . Alcohol use: Never    Frequency: Never  . Drug use: Never     Colonoscopy:  PAP:  Bone density:   No Known Allergies  Current Outpatient Medications  Medication Sig Dispense Refill  . Blood Pressure KIT 1 Device by Does not apply route 2 (two) times daily. 1 each 0  . enoxaparin (LOVENOX) 100 MG/ML injection Inject 1 mL (100 mg total) into the skin daily. 90 mL 0  . ferrous sulfate 325 (65 FE) MG tablet Take 1 tablet (325 mg total) by mouth daily with breakfast. 90 tablet 0  . Prenatal Vit-Fe Fumarate-FA (PRENATAL MULTIVITAMIN) TABS tablet Take 1 tablet by mouth daily at 12 noon.     No current facility-administered medications for this visit.     OBJECTIVE: Young African-American woman in no acute distress; husband in room during exam  There were no vitals filed for this visit.   There is no height or weight on file to calculate BMI.   Wt Readings from Last 3 Encounters:  02/25/18 134 lb 9.6 oz (61.1 kg)  02/05/18 141 lb 4.8 oz (64.1 kg)  02/03/18 147 lb (66.7 kg)      ECOG FS:0 - Asymptomatic  Sclerae unicteric, EOMs intact No cervical or supraclavicular adenopathy Lungs no rales or rhonchi Heart regular rate and rhythm Abd soft, nontender, positive bowel sounds MSK no focal spinal tenderness, no upper extremity lymphedema Neuro: nonfocal, well oriented, appropriate affect     LAB RESULTS:  CMP     Component Value Date/Time   NA 142 05/20/2018 1227   K 3.9 05/20/2018 1227   CL 108 05/20/2018 1227     CO2 28 05/20/2018 1227   GLUCOSE 89 05/20/2018 1227   BUN 7 05/20/2018 1227   CREATININE 0.87 05/20/2018 1227   CALCIUM 9.4 05/20/2018 1227   PROT 6.8 05/20/2018 1227   ALBUMIN 4.1 05/20/2018 1227   AST 72 (H) 05/20/2018 1227   ALT 186 (H) 05/20/2018 1227   ALKPHOS 150 (H) 05/20/2018 1227   BILITOT 0.3 05/20/2018 1227   GFRNONAA >60 05/20/2018 1227   GFRAA >60 05/20/2018 1227    No results found for: TOTALPROTELP, ALBUMINELP, A1GS, A2GS, BETS, BETA2SER, GAMS, MSPIKE, SPEI  No results found for: KPAFRELGTCHN, LAMBDASER, KAPLAMBRATIO  Lab Results  Component Value Date   WBC 5.2 05/20/2018   NEUTROABS 1.8 05/20/2018   HGB 11.7 (L) 05/20/2018   HCT 37.1 05/20/2018   MCV 85.7 05/20/2018   PLT 166 05/20/2018    @LASTCHEMISTRY @  No results found  for: LABCA2  No components found for: ZWCHEN277  No results for input(s): INR in the last 168 hours.  No results found for: LABCA2  No results found for: OEU235  No results found for: TIR443  No results found for: XVQ008  No results found for: CA2729  No components found for: HGQUANT  No results found for: CEA1 / No results found for: CEA1   No results found for: AFPTUMOR  No results found for: CHROMOGRNA  No results found for: PSA1  No visits with results within 3 Day(s) from this visit.  Latest known visit with results is:  Appointment on 05/20/2018  Component Date Value Ref Range Status  . D-Dimer, Quant 05/20/2018 <0.27  0.00 - 0.50 ug/mL-FEU Final   Comment: (NOTE) At the manufacturer cut-off of 0.50 ug/mL FEU, this assay has been documented to exclude PE with a sensitivity and negative predictive value of 97 to 99%.  At this time, this assay has not been approved by the FDA to exclude DVT/VTE. Results should be correlated with clinical presentation. Performed at [facility], [place] [address], [place], [place] [address]   . Sodium 05/20/2018 142  135 - 145 mmol/L Final  . Potassium  05/20/2018 3.9  3.5 - 5.1 mmol/L Final  . Chloride 05/20/2018 108  98 - 111 mmol/L Final  . CO2 05/20/2018 28  22 - 32 mmol/L Final  . Glucose, Bld 05/20/2018 89  70 - 99 mg/dL Final  . BUN 05/20/2018 7  6 - 20 mg/dL Final  . Creatinine, Ser 05/20/2018 0.87  0.44 - 1.00 mg/dL Final  . Calcium 05/20/2018 9.4  8.9 - 10.3 mg/dL Final  . Total Protein 05/20/2018 6.8  6.5 - 8.1 g/dL Final  . Albumin 05/20/2018 4.1  3.5 - 5.0 g/dL Final  . AST 05/20/2018 72* 15 - 41 U/L Final  . ALT 05/20/2018 186* 0 - 44 U/L Final  . Alkaline Phosphatase 05/20/2018 150* 38 - 126 U/L Final  . Total Bilirubin 05/20/2018 0.3  0.3 - 1.2 mg/dL Final  . GFR calc non Af Amer 05/20/2018 >60  >60 mL/min Final  . GFR calc Af Amer 05/20/2018 >60  >60 mL/min Final  . Anion gap 05/20/2018 6  5 - 15 Final   Performed at [facility], [place] [address] [place], [place] [address]  . WBC 05/20/2018 5.2  4.0 - 10.5 K/uL Final  . RBC 05/20/2018 4.33  3.87 - 5.11 MIL/uL Final  . Hemoglobin 05/20/2018 11.7* 12.0 - 15.0 g/dL Final  . HCT 05/20/2018 37.1  36.0 - 46.0 % Final  . MCV 05/20/2018 85.7  80.0 - 100.0 fL Final  . MCH 05/20/2018 27.0  26.0 - 34.0 pg Final  . MCHC 05/20/2018 31.5  30.0 - 36.0 g/dL Final  . RDW 05/20/2018 13.7  11.5 - 15.5 % Final  . Platelets 05/20/2018 166  150 - 400 K/uL Final  . nRBC 05/20/2018 0.0  0.0 - 0.2 % Final  . Neutrophils Relative % 05/20/2018 34  % Final  . Neutro Abs 05/20/2018 1.8  1.7 - 7.7 K/uL Final  . Lymphocytes Relative 05/20/2018 55  % Final  . Lymphs Abs 05/20/2018 2.9  0.7 - 4.0 K/uL Final  . Monocytes Relative 05/20/2018 8  % Final  . Monocytes Absolute 05/20/2018 0.4  0.1 - 1.0 K/uL Final  . Eosinophils Relative 05/20/2018 3  % Final  . Eosinophils Absolute 05/20/2018 0.1  0.0 - 0.5 K/uL Final  .  Basophils Relative 05/20/2018 0  % Final  . Basophils Absolute 05/20/2018 0.0  0.0 - 0.1 K/uL Final  . Immature Granulocytes 05/20/2018 0  % Final  . Abs  Immature Granulocytes 05/20/2018 0.00  0.00 - 0.07 K/uL Final   Performed at [facility], [place] [address] [place], [place] [address]    (this displays the last labs from the last 3 days)  No results found for: TOTALPROTELP, ALBUMINELP, A1GS, A2GS, BETS, BETA2SER, GAMS, MSPIKE, SPEI (this displays SPEP labs)  No results found for: KPAFRELGTCHN, LAMBDASER, KAPLAMBRATIO (kappa/lambda light chains)  No results found for: HGBA, HGBA2QUANT, HGBFQUANT, HGBSQUAN (Hemoglobinopathy evaluation)   Lab Results  Component Value Date   LDH 221 (H) 02/05/2018    No results found for: IRON, TIBC, IRONPCTSAT (Iron and TIBC)  Lab Results  Component Value Date   FERRITIN 21 02/04/2018    Urinalysis    Component Value Date/Time   COLORURINE YELLOW 02/05/2018 [place] 02/05/2018 1837   LABSPEC 1.009 02/05/2018 1837   PHURINE 6.0 02/05/2018 1837   GLUCOSEU NEGATIVE 02/05/2018 1837   HGBUR LARGE (A) 02/05/2018 1837   BILIRUBINUR NEGATIVE 02/05/2018 1837   KETONESUR NEGATIVE 02/05/2018 1837   PROTEINUR NEGATIVE 02/05/2018 1837   NITRITE NEGATIVE 02/05/2018 1837   LEUKOCYTESUR LARGE (A) 02/05/2018 1837     STUDIES: Ct Angio Chest Pe W Or Wo Contrast  Result Date: 04/28/2018 CLINICAL DATA:  Chest pain. Diagnosed with pulmonary embolism in 01/2018. Currently on Lovenox. EXAM: CT ANGIOGRAPHY CHEST WITH CONTRAST TECHNIQUE: Multidetector CT imaging of the chest was performed using the standard protocol during bolus administration of intravenous contrast. Multiplanar CT image reconstructions and MIPs were obtained to evaluate the vascular anatomy. CONTRAST:  197m ISOVUE-370 IOPAMIDOL (ISOVUE-370) INJECTION 76% COMPARISON:  02/03/2018 FINDINGS: Cardiovascular: Pulmonary arterial opacification is adequate. Calcification associated with a left lower lobe subsegmental branches slightly smaller, now measuring 2-3 mm (series 5, image 177, previously 4 mm). No new  emboli are identified. There is no evidence of thoracic aortic dissection or aneurysm. The heart is normal in size. There is no pericardial effusion. Mediastinum/Nodes: No enlarged axillary, mediastinal, or hilar lymph nodes. Unremarkable homogeneous soft tissue anteriorly in the mediastinum likely reflecting prominent thymus. Unremarkable included thyroid and esophagus. Lungs/Pleura: No pleural effusion or pneumothorax. Mild respiratory motion artifact. Left lower lobe subpleural ground-glass opacity on the prior CT has resolved, and the lungs are clear. Upper Abdomen: Unremarkable. Musculoskeletal: No acute osseous abnormality or suspicious osseous lesion. Review of the MIP images confirms the above findings. IMPRESSION: 1. Slightly decreased size of calcified subsegmental pulmonary embolism in the left lower lobe. 2. No evidence of acute pulmonary emboli or other acute abnormality in the chest. Electronically Signed   By: ALogan BoresM.D.   On: 04/28/2018 17:53   Vas [place] Extremity Venous (dvt)  Result Date: 05/20/2018  Lower Venous Study Indications: Pulmonary embolism.  Performing Technologist: GOliver HumRVT  Examination Guidelines: A complete evaluation includes B-mode imaging, spectral Doppler, color Doppler, and power Doppler as needed of all accessible portions of each vessel. Bilateral testing is considered an integral part of a complete examination. Limited examinations for reoccurring indications may be performed as noted.  Right Venous Findings: +---------+---------------+---------+-----------+----------+-------+          CompressibilityPhasicitySpontaneityPropertiesSummary +---------+---------------+---------+-----------+----------+-------+ CFV      Full           Yes      Yes                          +---------+---------------+---------+-----------+----------+-------+  SFJ      Full                                                  +---------+---------------+---------+-----------+----------+-------+ FV Prox  Full                                                 +---------+---------------+---------+-----------+----------+-------+ FV Mid   Full                                                 +---------+---------------+---------+-----------+----------+-------+ FV DistalFull                                                 +---------+---------------+---------+-----------+----------+-------+ PFV      Full                                                 +---------+---------------+---------+-----------+----------+-------+ POP      Full           Yes      Yes                          +---------+---------------+---------+-----------+----------+-------+ PTV      Full                                                 +---------+---------------+---------+-----------+----------+-------+ PERO     Full                                                 +---------+---------------+---------+-----------+----------+-------+  Left Venous Findings: +---+---------------+---------+-----------+----------+-------+    CompressibilityPhasicitySpontaneityPropertiesSummary +---+---------------+---------+-----------+----------+-------+ CFVFull           Yes      Yes                          +---+---------------+---------+-----------+----------+-------+    Summary: Right: There is no evidence of deep vein thrombosis in the lower extremity. No cystic structure found in the popliteal fossa. Left: No evidence of common femoral vein obstruction.  *See table(s) above for measurements and observations. Electronically signed by Servando Snare MD on 05/20/2018 at 3:29:06 PM.    Final     ELIGIBLE FOR AVAILABLE RESEARCH PROTOCOL: no  ASSESSMENT: 22 y.o.  [place] woman presenting 02/03/2018 with a left lower extremity pulmonary embolus 5 days postpartum  (1) treated initially with intravenous heparin, transition to  Lovenox as of  (2) hyper coagulable panel obtained 02/03/2018 shows no evidence of congenital or acquired coagulopathy  PLAN: Diana Lowe is tolerating the Lovenox with no complications.  I think it would be prudent to give it another 6 weeks or so and repeat a CT Angie of the chest to see if we see any further improvement in the very small pulmonary embolus.  In any case I do plan to stop the Lovenox shortly after that.  We discussed the results of the d-dimer which are favorable and which will be repeated late January.  I suggest that she establish herself with a primary care physician and she is going to contact contact the lobe our group  She understands that if she does become pregnant in the future she may well develop another clot and likely would benefit from anticoagulation during pregnancy.  I reassured her that the discomfort she is feeling in the left shoulder area is musculoskeletal.  I have made her a return appointment with me for March but likely she will not need to keep that appointment.   Diana Lowe, Virgie Dad, MD  05/28/18 5:08 PM Medical Oncology and Hematology [facility] [address] [place], [place] [address] Tel. [phone]    Fax. [phone]   I, Jacqualyn Posey am acting as a Education administrator for Chauncey Cruel, MD.   I, Lurline Del MD, have reviewed the above documentation for accuracy and completeness, and I agree with the above.

## 2018-05-29 ENCOUNTER — Telehealth: Payer: Self-pay

## 2018-05-29 ENCOUNTER — Inpatient Hospital Stay (HOSPITAL_BASED_OUTPATIENT_CLINIC_OR_DEPARTMENT_OTHER): Payer: Medicaid Other | Admitting: Oncology

## 2018-05-29 VITALS — BP 111/59 | HR 66 | Temp 97.9°F | Resp 18 | Ht 63.0 in | Wt 129.0 lb

## 2018-05-29 DIAGNOSIS — O1490 Unspecified pre-eclampsia, unspecified trimester: Secondary | ICD-10-CM

## 2018-05-29 DIAGNOSIS — D649 Anemia, unspecified: Secondary | ICD-10-CM

## 2018-05-29 DIAGNOSIS — O1495 Unspecified pre-eclampsia, complicating the puerperium: Secondary | ICD-10-CM

## 2018-05-29 DIAGNOSIS — Z7901 Long term (current) use of anticoagulants: Secondary | ICD-10-CM | POA: Diagnosis not present

## 2018-05-29 DIAGNOSIS — O8823 Thromboembolism in the puerperium: Secondary | ICD-10-CM

## 2018-05-29 DIAGNOSIS — I10 Essential (primary) hypertension: Secondary | ICD-10-CM

## 2018-05-29 DIAGNOSIS — Z86711 Personal history of pulmonary embolism: Secondary | ICD-10-CM | POA: Diagnosis not present

## 2018-05-29 DIAGNOSIS — D6949 Other primary thrombocytopenia: Secondary | ICD-10-CM

## 2018-05-29 DIAGNOSIS — Z79899 Other long term (current) drug therapy: Secondary | ICD-10-CM

## 2018-05-29 MED ORDER — ENOXAPARIN SODIUM 100 MG/ML ~~LOC~~ SOLN: 90.0000 mg | SUBCUTANEOUS | 0 refills | Status: DC

## 2018-05-29 NOTE — Telephone Encounter (Signed)
Printed avs and calender of upcoming appointment. Per 12/12 los 

## 2018-06-04 ENCOUNTER — Other Ambulatory Visit: Payer: Self-pay

## 2018-06-04 ENCOUNTER — Encounter: Payer: Self-pay | Admitting: Internal Medicine

## 2018-06-04 ENCOUNTER — Ambulatory Visit: Payer: Medicaid Other | Admitting: Internal Medicine

## 2018-06-04 VITALS — BP 90/60 | HR 87 | Temp 98.0°F | Ht 62.6 in | Wt 128.2 lb

## 2018-06-04 DIAGNOSIS — M62838 Other muscle spasm: Secondary | ICD-10-CM | POA: Diagnosis not present

## 2018-06-04 DIAGNOSIS — Z0000 Encounter for general adult medical examination without abnormal findings: Secondary | ICD-10-CM

## 2018-06-04 DIAGNOSIS — K2100 Gastro-esophageal reflux disease with esophagitis, without bleeding: Secondary | ICD-10-CM

## 2018-06-04 DIAGNOSIS — I2782 Chronic pulmonary embolism: Secondary | ICD-10-CM | POA: Diagnosis not present

## 2018-06-04 DIAGNOSIS — R0789 Other chest pain: Secondary | ICD-10-CM

## 2018-06-04 LAB — POCT URINALYSIS DIPSTICK
Bilirubin, UA: NEGATIVE
Blood, UA: NEGATIVE
Glucose, UA: NEGATIVE
Ketones, UA: NEGATIVE
LEUKOCYTES UA: NEGATIVE
NITRITE UA: NEGATIVE
PH UA: 5 (ref 5.0–8.0)
PROTEIN UA: NEGATIVE
SPEC GRAV UA: 1.025 (ref 1.010–1.025)
UROBILINOGEN UA: 0.2 U/dL

## 2018-06-04 MED ORDER — FAMOTIDINE 40 MG PO TABS: 40.0000 mg | ORAL_TABLET | Freq: Every day | ORAL | 1 refills | Status: DC

## 2018-06-04 NOTE — Patient Instructions (Signed)

## 2018-06-04 NOTE — Progress Notes (Signed)
Subjective:     Patient ID: Diana Lowe , female    DOB: 1996-04-10 , 22 y.o.   MRN: [id number]   Chief Complaint  Patient presents with  . New Patient (Initial Visit)    would like to discuss some sharp pain on left arm     HPI  1-Here to establish care with [place]. Had post partum PE 4 months ago. Had negative genetic testing for blood disorders and was not on  OC's at the time. The clot is getting smaller and may be able d/c the lovanox per her hematologist on her next check up. 2- L chest wall pain x 4 months. Provoked by movement and palpation. Sometimes has felt sharp pains on her chest and radiates to her L upper arm. Denies paresthesia of L arm.She works out frequently and does not bother her during exercise.  3- ENT told her she may have GERD a few years ago by the way her throat looked, but only takes tumbs prn. While pregnant she had worse GERD. Since hen feels a dull L chest pain when she eats sometimes. Denies N/V.   4- Wants her L neck muscle soreness checked since this started Neck falling asleep sitting up a few weeks ago. Then got better and bad again last week. She could hardly rotate her head.   5-Has had ear stuffy sensation off and on since treated for OM 2 weeks ago. Wants her ears checked.   Past Medical History:  Diagnosis Date  . Anemia   . Primary thrombocytopenia ([place])   . Pulmonary embolism ([facility])      Family History  Problem Relation Age of Onset  . Diabetes Maternal Grandmother   . Hypertension Maternal Grandfather      Current Outpatient Medications:  .  Blood Pressure KIT, 1 Device by Does not apply route 2 (two) times daily., Disp: 1 each, Rfl: 0 .  enoxaparin (LOVENOX) 100 MG/ML injection, Inject 0.9 mLs (90 mg total) into the skin daily., Disp: 60 Syringe, Rfl: 0 .  Prenatal Vit-Fe Fumarate-FA (PRENATAL MULTIVITAMIN) TABS tablet, Take 1 tablet by mouth daily at 12 noon., Disp: , Rfl:    No Known Allergies   Review of Systems   Constitutional: Positive for diaphoresis. Negative for appetite change, chills, fatigue and fever.  HENT: Positive for rhinorrhea. Negative for congestion, dental problem, ear discharge, ear pain, hearing loss, postnasal drip, sinus pressure, sinus pain, sore throat and trouble swallowing.        Has had ear stuffy sensation off and on since treated for OM 2 weeks ago.   Respiratory: Negative for cough, chest tightness and shortness of breath.   Cardiovascular: Positive for chest pain. Negative for palpitations and leg swelling.       See HPI  Gastrointestinal: Negative for abdominal pain, nausea and vomiting.       + GERD  Musculoskeletal: Positive for neck pain and neck stiffness. Negative for gait problem.  Skin: Negative for rash.  Neurological: Negative for light-headedness, numbness and headaches.  Hematological: Negative for adenopathy.     Today's Vitals   06/04/18 1001  BP: 90/60  Pulse: 87  Temp: 98 F (36.7 C)  TempSrc: Oral  SpO2: 96%  Weight: 128 lb 3.2 oz (58.2 kg)  Height: 5' 2.6" (1.59 m)   Body mass index is 23 kg/m.   Objective:  Physical Exam   Constitutional: She is oriented to person, place, and time. She appears well-developed and well-nourished. No distress.  HENT: Both TM's are gray and shiny Head: Normocephalic and atraumatic.  Right Ear: External ear normal.  Left Ear: External ear normal.  Nose: Nose normal.  Eyes: Conjunctivae are normal. Right eye exhibits no discharge. Left eye exhibits no discharge. No scleral icterus.  Neck: Neck supple. No thyromegaly present. Has tenderness of L upper sternocleidomastoid muscle and L upper trapezius.  Cardiovascular: Normal rate and regular rhythm. No murmur heard. Pulmonary/Chest: Effort normal and breath sounds normal. No respiratory distress. Has local tenderness of her L pectoralis muscle which is similar pain she has been having.  Musculoskeletal: Normal range of motion. She exhibits no edema.    Lymphadenopathy:    She has no cervical adenopathy.  Neurological: She is alert and oriented to person, place, and time. Has normal strength and reflexes of upper extremities.  Skin: Skin is warm and dry. Capillary refill takes less than 2 seconds. No rash noted. She is not diaphoretic.  Psychiatric: She has a normal mood and affect. Her behavior is normal. Judgment and thought content normal.  Nursing note reviewed.  Assessment And Plan:    1. Other chronic pulmonary embolism, unspecified whether acute cor pulmonale present ([place])- stable on current medication. Will continue care with hematologist  2. Gastroesophageal reflux disease with esophagitis- chronic. I placed her on Pepsid 40 mg qd. Fu 1 month  3. neck muscle spasms, L side- acute. Advised to use ice and heat alternation and after the head to stretches I taught her.   4. Chest wall tenderness- pectoralis muscle strain of unknown cause. Advised to not do upper arm weights or push up, til this was healed. May try Arnica cream topically on chest wall for discomfort if needed and she may also alternate ice and heat.   FU 1 month for physical and how her chest wall pain and GERD are doing.    Takiyah Bohnsack RODRIGUEZ-SOUTHWORTH, PA-C

## 2018-07-01 ENCOUNTER — Ambulatory Visit: Payer: Medicaid Other | Admitting: Internal Medicine

## 2018-07-01 ENCOUNTER — Encounter: Payer: Self-pay | Admitting: Internal Medicine

## 2018-07-01 VITALS — BP 90/68 | HR 58 | Temp 98.0°F | Ht 62.6 in | Wt 129.8 lb

## 2018-07-01 DIAGNOSIS — R7989 Other specified abnormal findings of blood chemistry: Secondary | ICD-10-CM

## 2018-07-01 DIAGNOSIS — Z0000 Encounter for general adult medical examination without abnormal findings: Secondary | ICD-10-CM | POA: Diagnosis not present

## 2018-07-01 DIAGNOSIS — H9201 Otalgia, right ear: Secondary | ICD-10-CM | POA: Diagnosis not present

## 2018-07-01 DIAGNOSIS — R945 Abnormal results of liver function studies: Secondary | ICD-10-CM | POA: Diagnosis not present

## 2018-07-01 LAB — POCT URINALYSIS DIPSTICK
Bilirubin, UA: NEGATIVE
Blood, UA: NEGATIVE
Glucose, UA: NEGATIVE
Ketones, UA: NEGATIVE
LEUKOCYTES UA: NEGATIVE
NITRITE UA: NEGATIVE
PH UA: 6.5 (ref 5.0–8.0)
Protein, UA: NEGATIVE
SPEC GRAV UA: 1.025 (ref 1.010–1.025)
UROBILINOGEN UA: 0.2 U/dL

## 2018-07-01 NOTE — Progress Notes (Signed)
Subjective:     Patient ID: Diana Lowe , female    DOB: 10/21/1995 , 23 y.o.   MRN: [id number]   Chief Complaint  Patient presents with  . Annual Exam    HPI  Pt is here for annual physical. She uses a couple of tylenol 500 mg a week prn pain or HA's, and only consumes occasional glass of wine. 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   Past Medical History:  Diagnosis Date  . Anemia   . Primary thrombocytopenia (HCC)   . Pulmonary embolism (HCC)      Family History  Problem Relation Age of Onset  . Diabetes Maternal Grandmother   . Hypertension Maternal Grandfather      Current Outpatient Medications:  .  enoxaparin (LOVENOX) 100 MG/ML injection, Inject 0.9 mLs (90 mg total) into the skin daily., Disp: 60 Syringe, Rfl: 0 .  Prenatal Vit-Fe Fumarate-FA (PRENATAL MULTIVITAMIN) TABS tablet, Take 1 tablet by mouth daily at 12 noon., Disp: , Rfl:    No Known Allergies   Review of Systems  Constitutional: Negative for chills, diaphoresis and fever.  HENT: Positive for ear pain.        R ear pai off and on, and sometimes pops  Eyes: Negative.   Respiratory: Negative for cough and shortness of breath.   Cardiovascular: Negative for chest pain, palpitations and leg swelling.  Gastrointestinal: Negative for constipation, diarrhea and nausea.  Endocrine: Negative.   Genitourinary: Negative for difficulty urinating, dysuria, frequency, vaginal bleeding and vaginal discharge.  Musculoskeletal: Positive for back pain and neck pain.       From nursing her infant, or position.   Skin: Negative for rash.  Allergic/Immunologic: Negative for environmental allergies and food allergies.  Neurological: Negative for dizziness, numbness and headaches.  Hematological: Negative for adenopathy.  Psychiatric/Behavioral: Negative for sleep disturbance. The patient is not nervous/anxious.        Depression     Today's Vitals   07/01/18 1545  BP: 90/68  Pulse: (!) 58  Temp: 98 F (36.7 C)  TempSrc: Oral  SpO2: 95%  Weight: 129 lb 12.8 oz (58.9 kg)  Height: 5' 2.6" (1.59 m)  PainSc: 2   PainLoc: Ear   Body mass index is 23.29 kg/m.   Objective:  Physical Exam BP 90/68 (BP Location: Left Arm, Patient Position: Sitting, Cuff Size: Small)   Pulse (!) 58   Temp 98 F (36.7 C) (Oral)   Ht 5' 2.6" (1.59 m)   Wt 129 lb 12.8 oz (58.9 kg)   SpO2 95%   BMI 23.29 kg/m   General Appearance:    Alert, cooperative, no distress, appears stated age  Head:    Normocephalic, without obvious abnormality, atraumatic  Eyes:    PERRL, conjunctiva/corneas clear, EOM's intact  Ears:    Normal TM's and external ear canals, both ears  Nose:   Nares normal, septum midline, mucosa normal, no drainage    or sinus tenderness  Throat:   Lips, mucosa, and tongue normal; teeth and gums normal  Neck:   Supple, symmetrical, trachea midline, no adenopathy;    thyroid:  no enlargement/tenderness/nodules.  Back:     Symmetric, no curvature, ROM normal, no CVA tenderness  Lungs:     Clear to auscultation bilaterally, respirations unlabored  Chest Wall:    No tenderness or deformity   Heart:    Regular rate and rhythm,  S1 and S2 normal, no murmur, rub   or gallop     Abdomen:     Soft, non-tender, bowel sounds active all four quadrants,    no masses, no organomegaly        Extremities:   Extremities normal, atraumatic, no cyanosis or edema  Pulses:   2+ and symmetric all extremities  Skin:   Skin color, texture, turgor normal, no rashes or lesions  Lymph nodes:   Cervical, supraclavicular, and axillary nodes normal  Neurologic:   CNII-XII intact, normal strength, sensation and reflexes    Throughout. Normal rhomberg, tandem gait, heel and tip toe gait, and finger to nose.    Assessment And Plan:     1. Encounter for general adult medical examination w/o abnormal findings- routine. FU 1 y - POCT Urinalysis  Dipstick (81002)  2- Elevated liver function test- chronic. She was not aware of this when I reviewed the labs from December her Hematologist ordered. I ordered a hepatitis panel.   3- Otalgia- no action Akeen Ledyard RODRIGUEZ-SOUTHWORTH, PA-C

## 2018-07-01 NOTE — Patient Instructions (Signed)
Preventive Care 18-39 Years, Female Preventive care refers to lifestyle choices and visits with your health care provider that can promote health and wellness. What does preventive care include?   A yearly physical exam. This is also called an annual well check.  Dental exams once or twice a year.  Routine eye exams. Ask your health care provider how often you should have your eyes checked.  Personal lifestyle choices, including: ? Daily care of your teeth and gums. ? Regular physical activity. ? Eating a healthy diet. ? Avoiding tobacco and drug use. ? Limiting alcohol use. ? Practicing safe sex. ? Taking vitamin and mineral supplements as recommended by your health care provider. What happens during an annual well check? The services and screenings done by your health care provider during your annual well check will depend on your age, overall health, lifestyle risk factors, and family history of disease. Counseling Your health care provider may ask you questions about your:  Alcohol use.  Tobacco use.  Drug use.  Emotional well-being.  Home and relationship well-being.  Sexual activity.  Eating habits.  Work and work environment.  Method of birth control.  Menstrual cycle.  Pregnancy history. Screening You may have the following tests or measurements:  Height, weight, and BMI.  Diabetes screening. This is done by checking your blood sugar (glucose) after you have not eaten for a while (fasting).  Blood pressure.  Lipid and cholesterol levels. These may be checked every 5 years starting at age 20.  Skin check.  Hepatitis C blood test.  Hepatitis B blood test.  Sexually transmitted disease (STD) testing.  BRCA-related cancer screening. This may be done if you have a family history of breast, ovarian, tubal, or peritoneal cancers.  Pelvic exam and Pap test. This may be done every 3 years starting at age 21. Starting at age 30, this may be done every 5  years if you have a Pap test in combination with an HPV test. Discuss your test results, treatment options, and if necessary, the need for more tests with your health care provider. Vaccines Your health care provider may recommend certain vaccines, such as:  Influenza vaccine. This is recommended every year.  Tetanus, diphtheria, and acellular pertussis (Tdap, Td) vaccine. You may need a Td booster every 10 years.  Varicella vaccine. You may need this if you have not been vaccinated.  HPV vaccine. If you are 26 or younger, you may need three doses over 6 months.  Measles, mumps, and rubella (MMR) vaccine. You may need at least one dose of MMR. You may also need a second dose.  Pneumococcal 13-valent conjugate (PCV13) vaccine. You may need this if you have certain conditions and were not previously vaccinated.  Pneumococcal polysaccharide (PPSV23) vaccine. You may need one or two doses if you smoke cigarettes or if you have certain conditions.  Meningococcal vaccine. One dose is recommended if you are age 19-21 years and a first-year college student living in a residence hall, or if you have one of several medical conditions. You may also need additional booster doses.  Hepatitis A vaccine. You may need this if you have certain conditions or if you travel or work in places where you may be exposed to hepatitis A.  Hepatitis B vaccine. You may need this if you have certain conditions or if you travel or work in places where you may be exposed to hepatitis B.  Haemophilus influenzae type b (Hib) vaccine. You may need this if you   have certain risk factors. Talk to your health care provider about which screenings and vaccines you need and how often you need them. This information is not intended to replace advice given to you by your health care provider. Make sure you discuss any questions you have with your health care provider. Document Released: 07/31/2001 Document Revised: 01/15/2017  Document Reviewed: 04/05/2015 Elsevier Interactive Patient Education  2019 Elsevier Inc.   

## 2018-07-02 LAB — CMP14 + ANION GAP
ALT: 50 IU/L — AB (ref 0–32)
ANION GAP: 14 mmol/L (ref 10.0–18.0)
AST: 28 IU/L (ref 0–40)
Albumin/Globulin Ratio: 2.3 — ABNORMAL HIGH (ref 1.2–2.2)
Albumin: 4.9 g/dL (ref 3.5–5.5)
Alkaline Phosphatase: 137 IU/L — ABNORMAL HIGH (ref 39–117)
BUN/Creatinine Ratio: 14 (ref 9–23)
BUN: 12 mg/dL (ref 6–20)
Bilirubin Total: 0.2 mg/dL (ref 0.0–1.2)
CALCIUM: 9.7 mg/dL (ref 8.7–10.2)
CO2: 23 mmol/L (ref 20–29)
CREATININE: 0.84 mg/dL (ref 0.57–1.00)
Chloride: 105 mmol/L (ref 96–106)
GFR calc Af Amer: 113 mL/min/{1.73_m2} (ref >59)
GFR, EST NON AFRICAN AMERICAN: 98 mL/min/{1.73_m2} (ref >59)
GLUCOSE: 87 mg/dL (ref 65–99)
Globulin, Total: 2.1 g/dL (ref 1.5–4.5)
Potassium: 4.2 mmol/L (ref 3.5–5.2)
Sodium: 142 mmol/L (ref 134–144)
Total Protein: 7 g/dL (ref 6.0–8.5)

## 2018-07-02 LAB — HEPATITIS PANEL, ACUTE
Hep A IgM: NEGATIVE
Hep B C IgM: NEGATIVE
Hepatitis B Surface Ag: NEGATIVE

## 2018-07-03 ENCOUNTER — Other Ambulatory Visit: Payer: Self-pay | Admitting: Internal Medicine

## 2018-07-03 DIAGNOSIS — R7989 Other specified abnormal findings of blood chemistry: Secondary | ICD-10-CM

## 2018-07-03 DIAGNOSIS — R945 Abnormal results of liver function studies: Principal | ICD-10-CM

## 2018-07-03 NOTE — Progress Notes (Signed)
Repeat CMP ordered for Fu LFT elevation

## 2018-07-08 ENCOUNTER — Other Ambulatory Visit: Payer: Self-pay | Admitting: Oncology

## 2018-07-11 MED ORDER — ENOXAPARIN SODIUM 100 MG/ML ~~LOC~~ SOLN: 90.0000 mg | SUBCUTANEOUS | 0 refills | Status: DC

## 2018-07-16 ENCOUNTER — Telehealth: Payer: Self-pay

## 2018-07-16 MED ORDER — ENOXAPARIN SODIUM 100 MG/ML ~~LOC~~ SOLN: 90.0000 mg | SUBCUTANEOUS | 0 refills | Status: DC

## 2018-07-16 NOTE — Telephone Encounter (Signed)
Patient called requesting information on why CT scan was denied.  Nurse reached out to staff that does PA's.  PA is needed a peer to peer.  Dr. Darnelle Catalan notified, and will plan to initiate this tomorrow.  Patient notified and voiced understanding.  Refill for Lovenox sent.  No further needs at this time.

## 2018-07-16 NOTE — Telephone Encounter (Signed)
Pt left voicemail requesting return call regarding upcoming appointment.   Nurse left voicemail for pt to return call to follow up.

## 2018-07-17 ENCOUNTER — Ambulatory Visit (HOSPITAL_COMMUNITY): Payer: Medicaid Other

## 2018-07-17 ENCOUNTER — Other Ambulatory Visit: Payer: Medicaid Other

## 2018-07-18 ENCOUNTER — Telehealth: Payer: Self-pay

## 2018-07-18 ENCOUNTER — Other Ambulatory Visit: Payer: Self-pay | Admitting: *Deleted

## 2018-07-18 NOTE — Telephone Encounter (Signed)
Patient requested appointment to be rescheduled with no preferences or details. Appointment was r/s for 3/17. Per 1/22 return voice msg calls

## 2018-07-28 ENCOUNTER — Telehealth: Payer: Self-pay | Admitting: Adult Health

## 2018-07-28 NOTE — Telephone Encounter (Signed)
Called and spoke with [name], and [name], on 07/28/2018 at 9 am, regarding order for CTA chest by Dr. Darnelle Catalan, in order to do peer to peer.   The last day patient was eligible for peer to peer was on 07/23/2018.  The status remains denied.  A new case was opened #[id number], with clinical notes needing to be faxed to [phone].   Time spent on call--15 minutes.  Lillard Anes, NP

## 2018-08-05 ENCOUNTER — Telehealth: Payer: Self-pay | Admitting: Adult Health

## 2018-08-05 NOTE — Telephone Encounter (Signed)
Called and spent 10 minutes on phone setting up peer to peer for patient CTA chest for 08/07/2018 at 1230 with Dr. Johny Blamer.    Lillard Anes, NP

## 2018-08-07 ENCOUNTER — Telehealth: Payer: Self-pay | Admitting: Adult Health

## 2018-08-07 NOTE — Telephone Encounter (Signed)
Received call from Dr. Karle Starch regarding CT angiogram for patient.  Reviewed post partum PE and need to see if PE had resolved after anticoagulation.  He approved CTA.  Authorization # [id number].  Lillard Anes, NP

## 2018-08-14 ENCOUNTER — Encounter (HOSPITAL_COMMUNITY): Payer: Self-pay | Admitting: *Deleted

## 2018-08-14 ENCOUNTER — Other Ambulatory Visit: Payer: Self-pay

## 2018-08-14 ENCOUNTER — Emergency Department (HOSPITAL_COMMUNITY)
Admission: EM | Admit: 2018-08-14 | Discharge: 2018-08-15 | Disposition: A | Discharge status: Home / Self Care | Payer: BLUE CROSS/BLUE SHIELD | Attending: Emergency Medicine | Admitting: Emergency Medicine

## 2018-08-14 ENCOUNTER — Ambulatory Visit: Payer: Medicaid Other | Admitting: Internal Medicine

## 2018-08-14 DIAGNOSIS — M79604 Pain in right leg: Secondary | ICD-10-CM | POA: Insufficient documentation

## 2018-08-14 DIAGNOSIS — Z5321 Procedure and treatment not carried out due to patient leaving prior to being seen by health care provider: Secondary | ICD-10-CM | POA: Insufficient documentation

## 2018-08-14 NOTE — ED Triage Notes (Signed)
Right calf cramping since this morning, no swelling. Hx of PE, was taking lovenox up until 1 month ago for the same.

## 2018-08-15 NOTE — ED Notes (Signed)
Pt wants to leaves. Says she will schedule [place] tomorrow. Pt refuses to stay and see provider. Pt gives labels.

## 2018-08-20 ENCOUNTER — Ambulatory Visit (HOSPITAL_COMMUNITY): Admission: RE | Admit: 2018-08-20 | Payer: Medicaid Other | Source: Ambulatory Visit

## 2018-08-21 ENCOUNTER — Ambulatory Visit: Payer: Medicaid Other | Admitting: Oncology

## 2018-09-01 NOTE — Progress Notes (Signed)
[facility]  [phone] [phone]    ID: Diana Lowe DOB: [birth year]  MR#: [id number]  MVE#:[id number]  Patient Care Team: Garey Ham, PA-C as PCP - General (Internal Medicine) Daizha Anand, Valentino Hue, MD as Consulting Physician (Oncology) OTHER MD:   CHIEF COMPLAINT: Pulmonary embolism  CURRENT TREATMENT:  Observation   HISTORY OF CURRENT ILLNESS: From the original intake note:  Diana Lowe is a 23 year old [place] woman currently day 5 from uncomplicated delivery of a baby boy 01/30/2018.  This morning, 02/03/2018, around 5 AM, she woke up with back pain.  This changed to a chest pressure sensation, not associated with shortness of breath, cough, or pleurisy.  This was not relieved by change in position or by trying reflux medications as was suggested by primary care.  Accordingly she presented to the emergency room approximately 6:30 in the morning where she was found to be hypertensive and to have some swelling of the left lower extremity.  Bilateral Doppler ultrasonography showed no evidence of DVT.  However a CT angiogram of the chest showed a calcified clot in the left lower lobe with associated subpleural changes.  The patient was started on intravenous heparin and we were consulted to evaluate for possible hypercoagulable state.   INTERVAL HISTORY: Diana Lowe returns today for follow-up and treatment of her pulmonary embolism.   We monitor her d-dimer initially: Results for Diana Lowe, Diana Lowe (MRN [id number]) as of 09/02/2018 25:34  Ref. Range 02/03/2018 06:30 03/13/2018 19:44 04/22/2018 12:18 05/20/2018 12:27  D-Dimer, Sharene Butters Latest Ref Range: 0.00 - 0.50 ug/mL-FEU 2.00 (H) <0.27 <0.27 <0.27   She discontinued her Lovenox Injections, 100 mg/ml once per day, in early 06/2018 due to an issue with her Insurance. She has not noticed any swelling in her ankles, but she is sore in her lower left leg anteriorly  at times.  She has had no respiratory symptoms.  Since her last visit here, she has not undergone any additional studies.     REVIEW OF SYSTEMS: Diana Lowe is concerned because her liver function tests have been elevated.  Takes occasional Tylenol, usually only once every two weeks.  She is not having any alcohol as she is breast-feeding.  She does not have a formal exercise routine. She notes that they are considering moving her boyfriend's job to the office due to COVID-19.  Diana Lowe notes some brief sharp pains in her upper bilateral quadrants. She lays on her side and sleeps beside her son and has developed some pain in the arm that she sleeps on. The patient denies unusual headaches, visual changes, nausea, vomiting, or dizziness. There has been no unusual cough, phlegm production, or pleurisy. This been no change in bowel or bladder habits. The patient denies unexplained fatigue or unexplained weight loss, bleeding, rash, or fever. A detailed review of systems was otherwise noncontributory.    PAST MEDICAL HISTORY: Past Medical History:  Diagnosis Date   Anemia    Primary thrombocytopenia (HCC)    Pulmonary embolism (HCC)     PAST SURGICAL HISTORY: Past Surgical History:  Procedure Laterality Date   WISDOM TOOTH EXTRACTION      FAMILY HISTORY: Family History  Problem Relation Age of Onset   Diabetes Maternal Grandmother    Hypertension Maternal Grandfather    As of August 2019 the patient's father is 72 years old.  The patient's mother is 62 years old.  He lives in [place].  She lives in [place].  The patient has  1 full brothers, 4/2 brothers and 2 half sisters.  She is not aware of any history of clotting or bleeding disorders in the family including no history of strokes or heart attacks.   GYNECOLOGIC HISTORY:  Her periods have always been very irregular although in the last year they came around once a month, between 3 and 5 weeks apart.   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  After her boyfriend moved to this area after she came to [place].  She is not currently working.  His name is  Chiropodist and he works in accounts payable locally.  At home it just the 2 of them and their baby, Diana Lowe.  The patient's mother in law Diana Lowe  lives nearby.                         ADVANCED DIRECTIVES:    HEALTH MAINTENANCE: Social History   Tobacco Use   Smoking status: Never Smoker   Smokeless tobacco: Never Used  Substance Use Topics   Alcohol use: Never    Frequency: Never   Drug use: Never     Colonoscopy:  PAP:  Bone density:   No Known Allergies  Current Outpatient Medications  Medication Sig Dispense Refill   enoxaparin (LOVENOX) 100 MG/ML injection Inject 0.9 mLs (90 mg total) into the skin daily for 30 days. 30 Syringe 0   Prenatal Vit-Fe Fumarate-FA (PRENATAL MULTIVITAMIN) TABS tablet Take 1 tablet by mouth daily at 12 noon.     No current facility-administered medications for this visit.     OBJECTIVE: Young African-American woman who appears well; described in the room during exam  Vitals:   09/02/18 1407  BP: (!) 105/59  Pulse: (!) 59  Resp: 18  Temp: 99 F (37.2 C)     Body mass index is 22.35 kg/m.   Wt Readings from Last 3 Encounters:  09/02/18 124 lb 9.6 oz (56.5 kg)  07/01/18 129 lb 12.8 oz (58.9 kg)  06/04/18 128 lb 3.2 oz (58.2 kg)      ECOG FS:1 - Symptomatic but completely ambulatory  Sclerae unicteric, pupils round and equal No cervical or supraclavicular adenopathy Lungs no rales or rhonchi, no rubs or wheezes Heart regular rate and rhythm Abd soft, nontender, positive bowel sounds MSK no focal spinal tenderness, no ankle edema or erythema Neuro: nonfocal, well oriented,  appropriate affect Breasts: Deferred     LAB RESULTS:  CMP     Component Value Date/Time   NA 142 07/01/2018 1651   K 4.2 07/01/2018 1651   CL 105 07/01/2018 1651   CO2 23 07/01/2018 1651   GLUCOSE 87 07/01/2018 1651   GLUCOSE 89 05/20/2018 1227   BUN 12 07/01/2018 1651   CREATININE 0.84 07/01/2018 1651   CALCIUM 9.7 07/01/2018 1651   PROT 7.0 07/01/2018 1651   ALBUMIN 4.9 07/01/2018 1651   AST 28 07/01/2018 1651   ALT 50 (H) 07/01/2018 1651   ALKPHOS 137 (H) 07/01/2018 1651   BILITOT 0.2 07/01/2018 1651   GFRNONAA 98 07/01/2018 1651   GFRAA 113 07/01/2018 1651    No results found for: TOTALPROTELP, ALBUMINELP, A1GS, A2GS, BETS, BETA2SER, GAMS, MSPIKE, SPEI  No results found for: KPAFRELGTCHN, LAMBDASER, KAPLAMBRATIO  Lab Results  Component  Value Date   WBC 5.2 05/20/2018   NEUTROABS 1.8 05/20/2018   HGB 11.7 (L) 05/20/2018   HCT 37.1 05/20/2018   MCV 85.7 05/20/2018   PLT 166 05/20/2018    @  No results found for: LABCA2  No components found for: ZOXWRU045  No results for input(s): INR in the last 168 hours.  No results found for: LABCA2  No results found for: WUJ811  No results found for: BJY782  No results found for: NFA213  No results found for: CA2729  No components found for: HGQUANT  No results found for: CEA1 / No results found for: CEA1   No results found for: AFPTUMOR  No results found for: CHROMOGRNA  No results found for: PSA1  No visits with results within 3 Day(s) from this visit.  Latest known visit with results is:  Office Visit on 07/01/2018  Component Date Value Ref Range Status   Color, UA 07/01/2018 yellow   Final   Clarity, UA 07/01/2018 clear   Final   Glucose, UA 07/01/2018 Negative  Negative Final   Bilirubin, UA 07/01/2018 negative   Final   Ketones, UA 07/01/2018 negative   Final   Spec Grav, UA 07/01/2018 1.025  1.010 - 1.025 Final   Blood, UA 07/01/2018 negative   Final   pH, UA  07/01/2018 6.5  5.0 - 8.0 Final   Protein, UA 07/01/2018 Negative  Negative Final   Urobilinogen, UA 07/01/2018 0.2  0.2 or 1.0 E.U./dL Final   Nitrite, UA 08/65/7846 negative   Final   Leukocytes, UA 07/01/2018 Negative  Negative Final   Glucose 07/01/2018 87  65 - 99 mg/dL Final   BUN 96/29/5284 12  6 - 20 mg/dL Final   Creatinine, Ser 07/01/2018 0.84  0.57 - 1.00 mg/dL Final   GFR calc non Af Amer 07/01/2018 98  >59 mL/min/1.73 Final   GFR calc Af Amer 07/01/2018 113  >59 mL/min/1.73 Final   BUN/Creatinine Ratio 07/01/2018 14  9 - 23 Final   Sodium 07/01/2018 142  134 - 144 mmol/L Final   Potassium 07/01/2018 4.2  3.5 - 5.2 mmol/L Final   Chloride 07/01/2018 105  96 - 106 mmol/L Final   CO2 07/01/2018 23  20 - 29 mmol/L Final   Anion Gap 07/01/2018 14.0  10.0 - 18.0 mmol/L Final   Calcium 07/01/2018 9.7  8.7 - 10.2 mg/dL Final   Total Protein 13/24/4010 7.0  6.0 - 8.5 g/dL Final   Albumin 27/25/3664 4.9  3.5 - 5.5 g/dL Final   Comment:     **Effective July 07, 2018 Albumin reference**       interval will be changing to:              Age                Female          Female           0 -  7 days        3.6 - 4.9      3.6 - 4.9           8 - 30 days        3.4 - 4.7      3.4 - 4.7           1 -  6 month       3.7 - 4.8      3.7 - 4.8    7 months -  2 years       3.9 - 5.0      3.9 - 5.0           3 -  5 years       4.0 - 5.0      4.0 - 5.0           6 - 12 years       4.1 - 5.0      4.0 - 5.0          13 - 30 years       4.1 - 5.2      3.9 - 5.0          31 - 50 years       4.0 - 5.0      3.8 - 4.8          51 - 60 years       3.8 - 4.9      3.8 - 4.9          61 - 70 years       3.8 - 4.8      3.8 - 4.8          71 - 80 years       3.7 - 4.7      3.7 - 4.7          81 - 89 years       3.6 - 4.6      3.6 - 4.6              >89 years       3.5 - 4.6      3.5 - 4.6    Globulin, Total 07/01/2018 2.1  1.5 - 4.5 g/dL Final   Albumin/Globulin Ratio 07/01/2018  2.3* 1.2 - 2.2 Final   Bilirubin Total 07/01/2018 0.2  0.0 - 1.2 mg/dL Final   Alkaline Phosphatase 07/01/2018 137* 39 - 117 IU/L Final   AST 07/01/2018 28  0 - 40 IU/L Final   ALT 07/01/2018 50* 0 - 32 IU/L Final   Hep A IgM 07/01/2018 Negative  Negative Final   Hepatitis B Surface Ag 07/01/2018 Negative  Negative Final   Hep B C IgM 07/01/2018 Negative  Negative Final   Hep C Virus Ab 07/01/2018 <0.1  0.0 - 0.9 s/co ratio Final   Comment:                                   Negative:     < 0.8                              Indeterminate: 0.8 - 0.9                                   Positive:     > 0.9  The CDC recommends that a positive HCV antibody result  be followed up with a HCV Nucleic Acid Amplification  test (161096).     (this displays the last labs from the last 3 days)  No results found for: TOTALPROTELP, ALBUMINELP, A1GS, A2GS, BETS, BETA2SER, GAMS, MSPIKE, SPEI (this displays SPEP labs)  No results found for: KPAFRELGTCHN, LAMBDASER, KAPLAMBRATIO (kappa/lambda light chains)  No results found for: HGBA, HGBA2QUANT,  HGBFQUANT, HGBSQUAN (Hemoglobinopathy evaluation)   Lab Results  Component Value Date   LDH 221 (H) 02/05/2018    No results found for: IRON, TIBC, IRONPCTSAT (Iron and TIBC)  Lab Results  Component Value Date   FERRITIN 21 02/04/2018    Urinalysis    Component Value Date/Time   COLORURINE YELLOW 02/05/2018 1837   APPEARANCEUR CLEAR 02/05/2018 1837   LABSPEC 1.009 02/05/2018 1837   PHURINE 6.0 02/05/2018 1837   GLUCOSEU NEGATIVE 02/05/2018 1837   HGBUR LARGE (A) 02/05/2018 1837   BILIRUBINUR negative 07/01/2018 1610   KETONESUR NEGATIVE 02/05/2018 1837   PROTEINUR Negative 07/01/2018 1610   PROTEINUR NEGATIVE 02/05/2018 1837   UROBILINOGEN 0.2 07/01/2018 1610   NITRITE negative 07/01/2018 1610   NITRITE NEGATIVE 02/05/2018 1837   LEUKOCYTESUR Negative 07/01/2018 1610     STUDIES: No results found.   ELIGIBLE FOR AVAILABLE  RESEARCH PROTOCOL: no   ASSESSMENT: 23 y.o.  [place] woman presenting 02/03/2018 with a left lower extremity pulmonary embolus 5 days postpartum  (1) treated initially with intravenous heparin, transition to Lovenox   (a) Lovenox discontinued early January for insurance reasons  (2) hyper coagulable panel obtained 02/03/2018 shows no evidence of congenital or acquired coagulopathy   PLAN: Diana Lowe received anticoagulation for her postpartum clot for a little over 4 months.  We had set her up for a restaging CT NGO and she was scheduled to have 6 months of Lovenox but her insurance denied those and she has been off anticoagulants now for 2 months.  She has no symptoms suggestive of recurrent DVT or residual symptoms from her earlier pulmonary embolus  We discussed risk factors for pulmonary embolism and she understands if and when she becomes pregnant again this might be an issue.  Trauma including surgery, infection, inflammation, and immobility can all increase the risk of clotting.  She is very concerned about her liver function tests and we discussed things that might cause that.  Her primary care physician has ruled out hepatitis viruses and the patient is not taking any alcohol or really any other medications including any herbal supplements.  At this point I feel comfortable releasing her to her primary care physicians care.  If there are any further concerns regarding clotting of course we will be glad to see her again but as of now we are making no further routine appointments for her here.  Diana Lowe, Valentino Hue, MD  09/02/18 2:31 PM Medical Oncology and Hematology [facility] [address] [place], [place] [address] Tel. [phone]    Fax. [phone]  I, Mal Misty am acting as a Neurosurgeon for Lowella Dell, MD.   I, Ruthann Cancer MD, have reviewed the above documentation for accuracy and completeness, and I agree with the above.

## 2018-09-02 ENCOUNTER — Inpatient Hospital Stay: Payer: BLUE CROSS/BLUE SHIELD | Attending: Oncology | Admitting: Oncology

## 2018-09-02 ENCOUNTER — Other Ambulatory Visit: Payer: Self-pay

## 2018-09-02 VITALS — BP 105/59 | HR 59 | Temp 99.0°F | Resp 18 | Ht 62.6 in | Wt 124.6 lb

## 2018-09-02 DIAGNOSIS — R945 Abnormal results of liver function studies: Secondary | ICD-10-CM | POA: Diagnosis not present

## 2018-09-02 DIAGNOSIS — I2699 Other pulmonary embolism without acute cor pulmonale: Secondary | ICD-10-CM | POA: Insufficient documentation

## 2018-09-02 DIAGNOSIS — O1495 Unspecified pre-eclampsia, complicating the puerperium: Secondary | ICD-10-CM

## 2018-09-02 DIAGNOSIS — R7989 Other specified abnormal findings of blood chemistry: Secondary | ICD-10-CM

## 2018-09-02 DIAGNOSIS — O8823 Thromboembolism in the puerperium: Secondary | ICD-10-CM

## 2018-09-30 ENCOUNTER — Ambulatory Visit: Payer: Medicaid Other | Admitting: Internal Medicine

## 2018-10-02 ENCOUNTER — Ambulatory Visit: Payer: Medicaid Other | Admitting: Nurse Practitioner

## 2018-11-03 ENCOUNTER — Telehealth: Payer: Self-pay

## 2018-11-03 NOTE — Telephone Encounter (Signed)
VERBAL CONSENT GIVEN FOR VIRTUAL VISIT 

## 2018-11-04 ENCOUNTER — Other Ambulatory Visit: Payer: Self-pay

## 2018-11-04 ENCOUNTER — Encounter: Payer: Self-pay | Admitting: Nurse Practitioner

## 2018-11-04 ENCOUNTER — Ambulatory Visit (INDEPENDENT_AMBULATORY_CARE_PROVIDER_SITE_OTHER): Payer: Medicaid Other | Admitting: Nurse Practitioner

## 2018-11-04 DIAGNOSIS — M546 Pain in thoracic spine: Secondary | ICD-10-CM

## 2018-11-04 DIAGNOSIS — M25561 Pain in right knee: Secondary | ICD-10-CM

## 2018-11-04 DIAGNOSIS — M25562 Pain in left knee: Secondary | ICD-10-CM

## 2018-11-04 NOTE — Progress Notes (Signed)
Virtual Visit via Video ([email or web address])    This visit type was conducted due to national recommendations for restrictions regarding the COVID-19 Pandemic (e.g. social distancing) in an effort to limit this patient's exposure and mitigate transmission in our community.  Patients identity confirmed using two different identifiers.  This format is felt to be most appropriate for this patient at this time.  All issues noted in this document were discussed and addressed.  No physical exam was performed (except for noted visual exam findings with Video Visits).    Date:  11/13/2018   ID:  Diana Lowe, DOB 04/08/1996, MRN [id number]  Patient Location:  Home - [place] Fro[place]ich[place] Lowe  Provider location:   Office    Chief Complaint:  Back pain  History of Present Illness:    Diana Lowe is a 23 y.o. female who presents via Programmer, applicationsvideo conferencing for a telehealth visit today.    The patient does not have symptoms concerning for COVID-19 infection (fever, chills, cough, or new shortness of breath).   Mattress is just at a year old.  She is not working.  She is not exercising regularly.  No previous history.  She is breast feeding currently.  Dull pain and stretch her back.  The pain is upper area.    Back Pain  This is a new problem. The current episode started more than 1 month ago. The problem occurs intermittently. Pain location: upper and lower back pain. The pain does not radiate. The pain is at a severity of 6/10. Pertinent negatives include no abdominal pain. The treatment provided no relief.  Knee Pain   The incident occurred more than 1 week ago. Injury mechanism: when running. The pain is present in the left knee and right knee. The pain is mild. Pertinent negatives include no inability to bear weight. She has tried nothing (currently breast feeding) for the symptoms.     Past Medical History:  Diagnosis Date  . Anemia   . Primary thrombocytopenia (HCC)   . Pulmonary embolism  [facility](HCC)    Past Surgical History:  Procedure Laterality Date  . WISDOM TOOTH EXTRACTION       Current Meds  Medication Sig  . Prenatal Vit-Fe Fumarate-FA (PRENATAL MULTIVITAMIN) TABS tablet Take 1 tablet by mouth daily at 12 noon.     Allergies:   Patient has no known allergies.   Social History   Tobacco Use  . Smoking status: Never Smoker  . Smokeless tobacco: Never Used  Substance Use Topics  . Alcohol use: Never    Frequency: Never  . Drug use: Never     Family Hx: The patient's family history includes Diabetes in her maternal grandmother; Hypertension in her maternal grandfather.  ROS:   Please see the history of present illness.    Review of Systems  Gastrointestinal: Negative for abdominal pain.  Musculoskeletal: Positive for back pain.    All other systems reviewed and are negative.   Labs/Other Tests and Data Reviewed:    Recent Labs: 02/03/2018: B Natriuretic Peptide 393.8 02/06/2018: Magnesium 5.1 05/20/2018: Hemoglobin 11.7; Platelets 166 07/01/2018: ALT 50; BUN 12; Creatinine, Ser 0.84; Potassium 4.2; Sodium 142   Recent Lipid Panel No results found for: CHOL, TRIG, HDL, CHOLHDL, LDLCALC, LDLDIRECT  Wt Readings from Last 3 Encounters:  09/02/18 124 lb 9.6 oz (56.5 kg)  07/01/18 129 lb 12.8 oz (58.9 kg)  06/04/18 128 lb 3.2 oz (58.2 kg)     Exam:    Vital Signs:  There  were no vitals taken for this visit.    Physical Exam  Constitutional: She is oriented to person, place, and time and well-developed, well-nourished, and in no distress.  Musculoskeletal: Normal range of motion.  Neurological: She is alert and oriented to person, place, and time.  Psychiatric: Mood, memory, affect and judgment normal.    ASSESSMENT & PLAN:    1. Acute pain of both knees  No abnormal swelling noted   She is requesting xrays of her knees - DG Knee Bilateral Standing AP; Future  2. Acute midline thoracic back pain  1 month history of back pain and is  requesting an xray of the thoracic area  Encouraged to take tylenol as needed    COVID-19 Education: The signs and symptoms of COVID-19 were discussed with the patient and how to seek care for testing (follow up with PCP or arrange E-visit).  The importance of social distancing was discussed today.  Patient Risk:   After full review of this patients clinical status, I feel that they are at least moderate risk at this time.  Time:   Today, I have spent 10 minutes/ seconds with the patient with telehealth technology discussing above diagnoses.     Medication Adjustments/Labs and Tests Ordered: Current medicines are reviewed at length with the patient today.  Concerns regarding medicines are outlined above.   Tests Ordered: Orders Placed This Encounter  Procedures  . DG Knee Bilateral Standing AP    Medication Changes: No orders of the defined types were placed in this encounter.   Disposition:  Follow up prn  Signed, Arnette Felts, FNP

## 2018-11-06 ENCOUNTER — Telehealth: Payer: Self-pay

## 2018-11-06 NOTE — Telephone Encounter (Signed)
Patient called stating she didn't receive a call with an appointment date and time for her xrays.  RETURNED PT CALL AND NOTIFIED HER TO GO TO [facility] ON [address] WHEN SHE CAN TO HAVE IT DONE. Adolph Pollack

## 2018-11-07 ENCOUNTER — Other Ambulatory Visit: Payer: Self-pay

## 2018-11-07 ENCOUNTER — Ambulatory Visit
Admission: RE | Admit: 2018-11-07 | Discharge: 2018-11-07 | Disposition: A | Discharge status: Home / Self Care | Payer: Medicaid Other | Source: Ambulatory Visit | Attending: Nurse Practitioner | Admitting: Nurse Practitioner

## 2018-11-07 ENCOUNTER — Other Ambulatory Visit: Payer: Self-pay | Admitting: Nurse Practitioner

## 2018-11-07 DIAGNOSIS — M25562 Pain in left knee: Secondary | ICD-10-CM

## 2018-11-07 DIAGNOSIS — M546 Pain in thoracic spine: Secondary | ICD-10-CM

## 2018-11-07 DIAGNOSIS — M25561 Pain in right knee: Secondary | ICD-10-CM

## 2018-11-12 ENCOUNTER — Other Ambulatory Visit: Payer: Self-pay | Admitting: Nurse Practitioner

## 2018-11-12 DIAGNOSIS — M4124 Other idiopathic scoliosis, thoracic region: Secondary | ICD-10-CM

## 2018-11-12 DIAGNOSIS — M546 Pain in thoracic spine: Secondary | ICD-10-CM

## 2018-11-12 NOTE — Progress Notes (Signed)
Most times related to posture and commonly seen in children and young adults. It is important to have good posture sitting up straight.  I have referred her to orthopedics

## 2018-11-13 ENCOUNTER — Encounter: Payer: Self-pay | Admitting: Nurse Practitioner

## 2018-11-27 ENCOUNTER — Ambulatory Visit: Payer: Medicaid Other | Admitting: Internal Medicine

## 2018-12-09 ENCOUNTER — Ambulatory Visit: Payer: Medicaid Other | Admitting: Internal Medicine

## 2018-12-11 ENCOUNTER — Ambulatory Visit: Payer: Medicaid Other | Admitting: Internal Medicine

## 2019-01-01 ENCOUNTER — Ambulatory Visit: Payer: Medicaid Other | Admitting: Podiatry

## 2019-01-21 IMAGING — CR DG CHEST 2V
2 series · 2 of 2 positions shown · non-contrast
Comparison: 02/03/2018 and chest CTA dated 02/03/2018.

CLINICAL DATA: Intermittent mid left chest pain since this morning.
Mild shortness of breath.

EXAM:
CHEST - 2 VIEW

[chest pa]
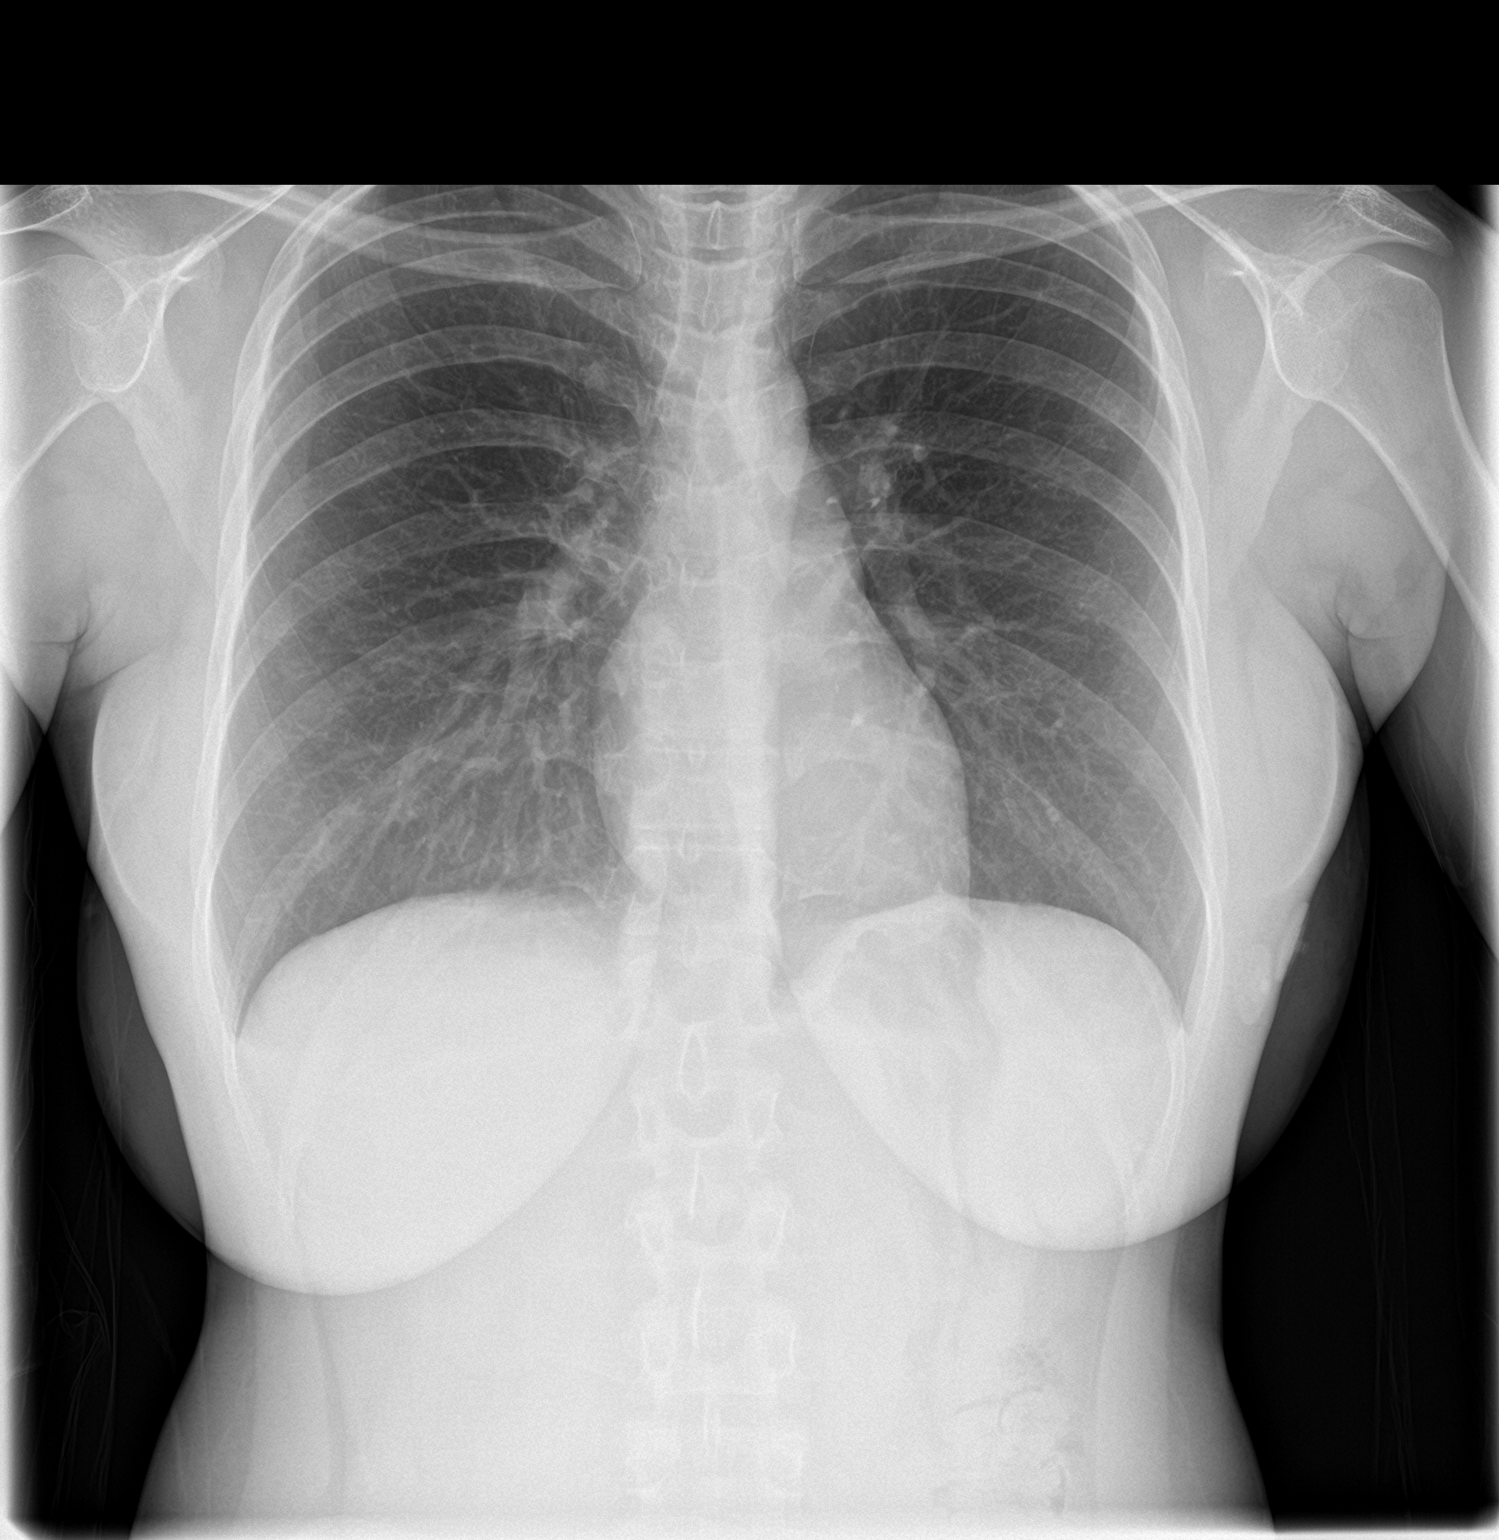

[chest lat]
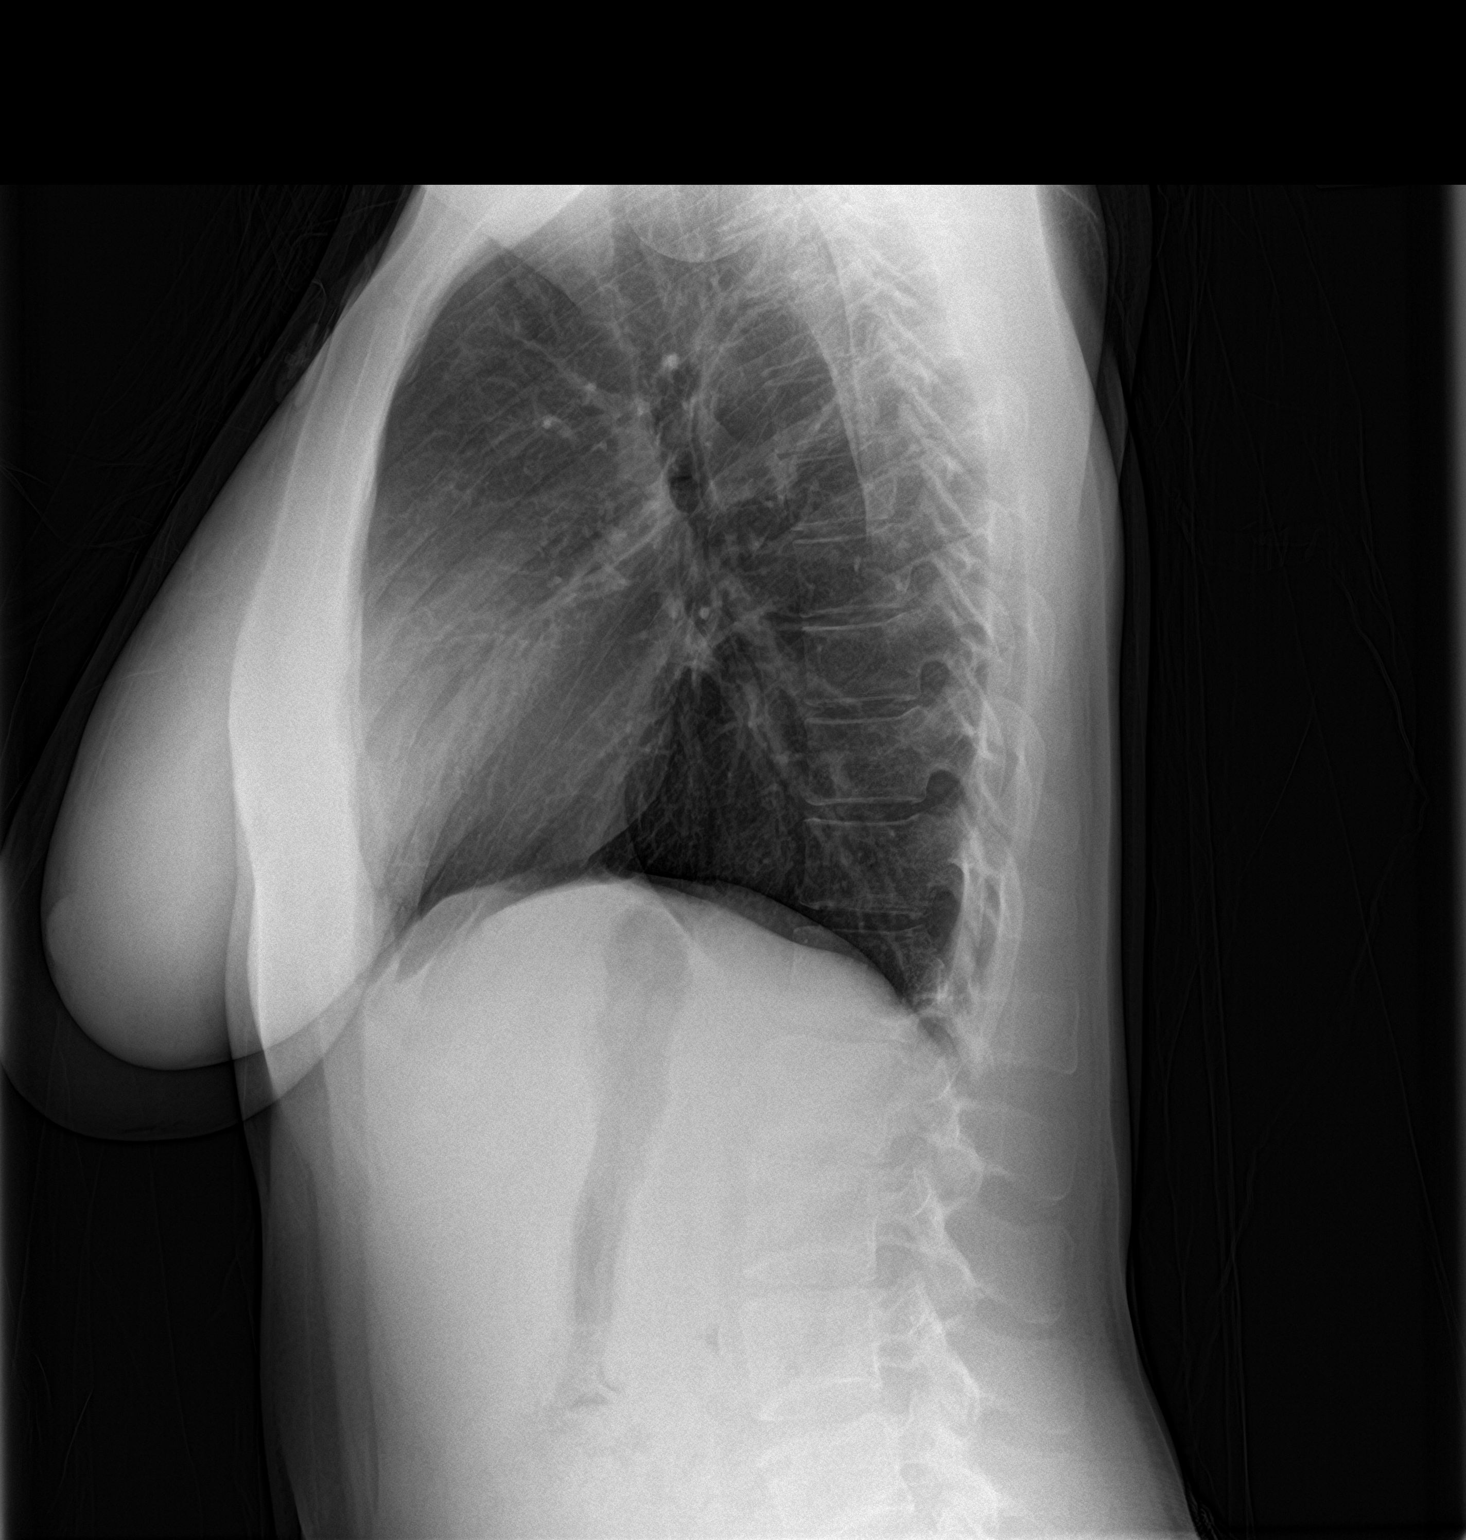

[2 of 2 positions shown; findings below may reference images not displayed]

FINDINGS: The heart size and mediastinal contours are within normal limits.
Both lungs are clear. The visualized skeletal structures are
unremarkable.
IMPRESSION: Normal examination.

## 2019-03-26 ENCOUNTER — Encounter: Payer: Self-pay | Admitting: Internal Medicine

## 2019-03-26 ENCOUNTER — Other Ambulatory Visit: Payer: Self-pay

## 2019-03-26 ENCOUNTER — Ambulatory Visit: Payer: Medicaid Other | Admitting: Internal Medicine

## 2019-03-26 ENCOUNTER — Telehealth: Payer: Self-pay | Admitting: *Deleted

## 2019-03-26 VITALS — BP 118/72 | HR 78 | Temp 98.6°F | Ht 63.2 in | Wt 125.8 lb

## 2019-03-26 DIAGNOSIS — Z01818 Encounter for other preprocedural examination: Secondary | ICD-10-CM | POA: Diagnosis not present

## 2019-03-26 DIAGNOSIS — O8823 Thromboembolism in the puerperium: Secondary | ICD-10-CM

## 2019-03-26 DIAGNOSIS — R011 Cardiac murmur, unspecified: Secondary | ICD-10-CM

## 2019-03-26 DIAGNOSIS — Z86711 Personal history of pulmonary embolism: Secondary | ICD-10-CM

## 2019-03-26 DIAGNOSIS — R7989 Other specified abnormal findings of blood chemistry: Secondary | ICD-10-CM

## 2019-03-26 LAB — CBC
Hematocrit: 40.5 % (ref 34.0–46.6)
Hemoglobin: 12.8 g/dL (ref 11.1–15.9)
MCH: 27.4 pg (ref 26.6–33.0)
MCHC: 31.6 g/dL (ref 31.5–35.7)
MCV: 87 fL (ref 79–97)
Platelets: 175 10*3/uL (ref 150–450)
RBC: 4.68 x10E6/uL (ref 3.77–5.28)
RDW: 13.2 % (ref 11.7–15.4)
WBC: 6.9 10*3/uL (ref 3.4–10.8)

## 2019-03-26 MED ORDER — PARAGARD INTRAUTERINE COPPER IU IUD: 1.0000 | INTRAUTERINE_SYSTEM | Freq: Once | INTRAUTERINE | 0 refills | Status: AC

## 2019-03-26 NOTE — Progress Notes (Addendum)
  Subjective:     Patient ID: Diana Lowe , female    DOB: 1996/01/02 , 23 y.o.   MRN: [id number]   Chief Complaint  Patient presents with  . Medical Clearance    HPI  Pt is here for pre-op labs drawn to have brazilian lift and butt lift. Surgery is 04/06/19. Needs labs faxed to [phone] Will need to set hematologist apt for clearance which is what the surgeon from [facility] wants.  She has never had surgery. Denies family history of anesthesia complications.  She also needs to FU on elevated AST today.  Past Medical History:  Diagnosis Date  . Anemia   . Primary thrombocytopenia ([place])   . Pulmonary embolism (HCC)      Family History  Problem Relation Age of Onset  . Diabetes Maternal Grandmother   . Hypertension Maternal Grandfather     MEDS-  Coper IUD   No Known Allergies   Review of Systems  Denies fever, chills, sweats, chest pain, SOB, hx of heart murmur, edema, abdominal pain, urinary difficulty, arthralgias of rashes. She denies not being able to endure exercising. Denies bruising.  Today's Vitals   03/26/19 1151  BP: 118/72  Pulse: 78  Temp: 98.6 F (37 C)  TempSrc: Oral  Weight: 125 lb 12.8 oz (57.1 kg)  Height: 5' 3.2" (1.605 m)   Body mass index is 22.14 kg/m.   Objective:  Physical Exam   Constitutional: She is oriented to person, place, and time. She appears well-developed and well-nourished. No distress.  HENT: Head: Normocephalic and atraumatic.  Right Ear: External ear normal.  Left Ear: External ear normal.  Nose: Nose normal.  Eyes: Conjunctivae are normal. Right eye exhibits no discharge. Left eye exhibits no discharge. No scleral icterus.  Neck: Neck supple. No thyromegaly present.  No carotid bruits bilaterally  Cardiovascular: Normal rate and regular rhythm.  1-2/6 murmur heard and louder at the upper SB and while sitting up. She has no past hx of heart murmur. No edema. Pulses +2/4 on upper and lower extremities.   Pulmonary/Chest: Effort normal and breath sounds normal. No respiratory distress.  Musculoskeletal: Normal range of motion. She exhibits no edema.  Lymphadenopathy: She has no cervical adenopathy.  Neurological: She is alert and oriented to person, place, and time.  Skin: Skin is warm and dry. Capillary refill takes less than 2 seconds. No rash noted. She is not diaphoretic.No rashes or wounds noted. Extrem- no edema, normal pulses.   Psychiatric: She has a normal mood and affect. Her behavior is normal. Judgment and thought content normal.  Nursing note reviewed.    Assessment And Plan:  1. Preop Labs needed- I spoke with the surgical coordinator and she said pt did not need a clearance from me, only labs.  - CBC no Diff - PT AND PTT CMP 2. Heart murmur- new and asymptomatic.      Will need clearance by cardiology for surgery. - Ambulatory referral to Cardiology  3. Pulmonary embolism, blood-clot, obstetric, postpartum condition- past hx of this and follows up with a hematologist.   04/02/19- pt called saying she needs a letter for clearance now when pt and receptionist at the surgery center whom I spoke with said she only needed labs, not a pre-op physical or clearance.  She needs clearance from cardiology.   Arch Methot RODRIGUEZ-SOUTHWORTH, PA-C    THE PATIENT IS ENCOURAGED TO PRACTICE SOCIAL DISTANCING DUE TO THE COVID-19 PANDEMIC.

## 2019-03-26 NOTE — Telephone Encounter (Signed)
This RN spoke with pt per her call this am ( note pt states she has called several times prior without a return call ) per her need for " clearance to have surgery scheduled on the 16 th "  Levi states she is having a liposuction procedure in [place] under Dr Dorthea Cove.  This RN informed pt normally this office receives request for surgical clearance from the surgeon's office.  Justa states she was mailed " papers that they said I needed to send to my doctors for release " note pt is now residing in the [place] area.  Per above- this RN contacted Dr Nanci Pina office and attempted to reach medical records per above- with no answer post lengthy rings ( greater then 15 ) with no transfer to a VM.  Per second attempt this RN per automated request - choose the " surgical center " and per discussion with Luna Fuse- of above and concern that pt is scheduled for procedure - without medical clearance due to history of pulmonary embolism post childbirth.  Luna Fuse will look into situation and return call to this RN.  Luna Fuse returned call and stated " yes we just need clearance from her hematologist"  This RN informed her this is her hematologist but again reiterated needed data of procedure in written form for appropriate review and possible need for blood thinners for clearance to be given.  Luna Fuse stated " we gave the patient all that paper work "   This RN again stated pt is unable to bring papers to this office due her current living location ( over 2 hours away ) and there fore call is to request data to be faxed to this office that  they sent the patient for medical clearance to be provided.  This RN reiterated concern is that patient has history of Pulmonary Embolism and could be at high risk for episode with surgery if not properly treated/  Luna Fuse will fax information and release to this RN.

## 2019-03-27 LAB — PT AND PTT
INR: 1 (ref 0.9–1.2)
Prothrombin Time: 10.9 s (ref 9.1–12.0)
aPTT: 29 s (ref 24–33)

## 2019-03-30 ENCOUNTER — Telehealth: Payer: Self-pay | Admitting: *Deleted

## 2019-03-30 ENCOUNTER — Telehealth: Payer: Self-pay

## 2019-03-30 ENCOUNTER — Other Ambulatory Visit: Payer: Self-pay | Admitting: Internal Medicine

## 2019-03-30 NOTE — Telephone Encounter (Signed)
This RN spoke with pt per her arrival to our lobby regarding cosmetic surgery " Brazilian butt lift " and " they are wanting clearance ".  Note pt was on the phone with them when nurse arrived to speak with her- and this RN again spoke with Diana Lowe who stated " we don't have any Op notes or a release- we just need a verbal ok "  This RN again reiterated need per MD at this office to have name and description of procedure in writing for documentation.  Fax received directly- given to MD- with noted recommendation that pt should have anticoagulation therapy post procedure- which was written on information and this RN faxed to surgical center with confirmation.  This RN took copy of form with recommendations to pt in lobby and discussed with her concern relating to high risk for blood clot with any surgical procedure- and MD recommendation for lovenox post surgery.  Note goal would be for attending surgeon to write for lovenox.  Diana Lowe stated " I can do the lovenox - " as well as if cosmetic surgeon's office does not prescribe to let this office know.  No further needs at this time.

## 2019-03-30 NOTE — Telephone Encounter (Signed)
Hey this patient dropped by the office and she wannted to know her lab results she stated she is having surgery on the 19th and they need her bloodwork before then I advised pt that you only work on thursdays now but that I would still send you a message just in case you happen to be working. YRL,RMA

## 2019-03-31 ENCOUNTER — Ambulatory Visit (INDEPENDENT_AMBULATORY_CARE_PROVIDER_SITE_OTHER): Payer: Medicaid Other | Admitting: Cardiology

## 2019-03-31 ENCOUNTER — Encounter: Payer: Self-pay | Admitting: Cardiology

## 2019-03-31 ENCOUNTER — Other Ambulatory Visit: Payer: Self-pay

## 2019-03-31 ENCOUNTER — Encounter: Payer: Self-pay | Admitting: Internal Medicine

## 2019-03-31 VITALS — BP 98/64 | HR 63 | Wt 126.2 lb

## 2019-03-31 DIAGNOSIS — R011 Cardiac murmur, unspecified: Secondary | ICD-10-CM

## 2019-03-31 DIAGNOSIS — Z01818 Encounter for other preprocedural examination: Secondary | ICD-10-CM | POA: Diagnosis not present

## 2019-03-31 LAB — CMP14 + ANION GAP
ALT: 22 IU/L (ref 0–32)
AST: 15 IU/L (ref 0–40)
Albumin/Globulin Ratio: 2 (ref 1.2–2.2)
Albumin: 4.7 g/dL (ref 3.9–5.0)
Alkaline Phosphatase: 115 IU/L (ref 39–117)
Anion Gap: 13 mmol/L (ref 10.0–18.0)
BUN/Creatinine Ratio: 14 (ref 9–23)
BUN: 10 mg/dL (ref 6–20)
Bilirubin Total: 0.3 mg/dL (ref 0.0–1.2)
CO2: 24 mmol/L (ref 20–29)
Calcium: 9.7 mg/dL (ref 8.7–10.2)
Chloride: 105 mmol/L (ref 96–106)
Creatinine, Ser: 0.7 mg/dL (ref 0.57–1.00)
GFR calc Af Amer: 139 mL/min/{1.73_m2} (ref >59)
GFR calc non Af Amer: 121 mL/min/{1.73_m2} (ref >59)
Globulin, Total: 2.4 g/dL (ref 1.5–4.5)
Glucose: 88 mg/dL (ref 65–99)
Potassium: 4.2 mmol/L (ref 3.5–5.2)
Sodium: 142 mmol/L (ref 134–144)
Total Protein: 7.1 g/dL (ref 6.0–8.5)

## 2019-03-31 NOTE — Patient Instructions (Signed)
Medication Instructions:  Your physician recommends that you continue on your current medications as directed. Please refer to the Current Medication list given to you today.  If you need a refill on your cardiac medications before your next appointment, please call your pharmacy.   Lab work: NONE If you have labs (blood work) drawn today and your tests are completely normal, you will receive your results only by: Marland Kitchen MyChart Message (if you have MyChart) OR . A paper copy in the mail If you have any lab test that is abnormal or we need to change your treatment, we will call you to review the results.  Testing/Procedures: Your physician has requested that you have an echocardiogram. Echocardiography is a painless test that uses sound waves to create images of your heart. It provides your doctor with information about the size and shape of your heart and how well your heart's chambers and valves are working. This procedure takes approximately one hour. There are no restrictions for this procedure. You may get an IV, if needed, to receive an ultrasound enhancing agent through to better visualize your heart.    Follow-Up: At [facility], you and your health needs are our priority.  As part of our continuing mission to provide you with exceptional heart care, we have created designated Provider Care Teams.  These Care Teams include your primary Cardiologist (physician) and Advanced Practice Providers (APPs -  Physician Assistants and Nurse Practitioners) who all work together to provide you with the care you need, when you need it. You will need a follow up appointment in as needed. You may see Debbe Odea, MD or one of the following Advanced Practice Providers on your designated Care Team:   Nicolasa Ducking, NP Eula Listen, PA-C . Marisue Ivan, PA-C    Echocardiogram An echocardiogram is a procedure that uses painless sound waves (ultrasound) to produce an image of the heart.  Images from an echocardiogram can provide important information about:  Signs of coronary artery disease (CAD).  Aneurysm detection. An aneurysm is a weak or damaged part of an artery wall that bulges out from the normal force of blood pumping through the body.  Heart size and shape. Changes in the size or shape of the heart can be associated with certain conditions, including heart failure, aneurysm, and CAD.  Heart muscle function.  Heart valve function.  Signs of a past heart attack.  Fluid buildup around the heart.  Thickening of the heart muscle.  A tumor or infectious growth around the heart valves. Tell a health care provider about:  Any allergies you have.  All medicines you are taking, including vitamins, herbs, eye drops, creams, and over-the-counter medicines.  Any blood disorders you have.  Any surgeries you have had.  Any medical conditions you have.  Whether you are pregnant or may be pregnant. What are the risks? Generally, this is a safe procedure. However, problems may occur, including:  Allergic reaction to dye (contrast) that may be used during the procedure. What happens before the procedure? No specific preparation is needed. You may eat and drink normally. What happens during the procedure?   An IV tube may be inserted into one of your veins.  You may receive contrast through this tube. A contrast is an injection that improves the quality of the pictures from your heart.  A gel will be applied to your chest.  A wand-like tool (transducer) will be moved over your chest. The gel will help to transmit the  sound waves from the transducer.  The sound waves will harmlessly bounce off of your heart to allow the heart images to be captured in real-time motion. The images will be recorded on a computer. The procedure may vary among health care providers and hospitals. What happens after the procedure?  You may return to your normal, everyday life,  including diet, activities, and medicines, unless your health care provider tells you not to do that. Summary  An echocardiogram is a procedure that uses painless sound waves (ultrasound) to produce an image of the heart.  Images from an echocardiogram can provide important information about the size and shape of your heart, heart muscle function, heart valve function, and fluid buildup around your heart.  You do not need to do anything to prepare before this procedure. You may eat and drink normally.  After the echocardiogram is completed, you may return to your normal, everyday life, unless your health care provider tells you not to do that. This information is not intended to replace advice given to you by your health care provider. Make sure you discuss any questions you have with your health care provider. Document Released: 06/01/2000 Document Revised: 09/25/2018 Document Reviewed: 07/07/2016 Elsevier Patient Education  2020 [employer].

## 2019-03-31 NOTE — Progress Notes (Signed)
Cardiology Office Note:    Date:  03/31/2019   ID:  Diana Lowe, DOB [birth year], MRN [id number]  PCP:  Shelby Mattocks, PA-C  Cardiologist:  Kate Sable, MD  Electrophysiologist:  None   Referring MD: Carol Ada*   Chief Complaint  Patient presents with  . OTHER    Heart Murmur pt needing cardiac clearance for lipo. Meds reviewed verbally with pt.    History of Present Illness:    Diana Lowe is a 23 y.o. female with a hx of pulmonary embolus after the birth of her child, who presents due to her heart murmur.  Patient is scheduled to get a [place] butt lift and went to her primary care provider for an evaluation.  During the evaluation, a slight murmur was heard.  Patient denies any symptoms of chest pain, shortness of breath, edema, palpitations, syncope.  She denies any history of cardiac disease.  Denies smoking.  Past Medical History:  Diagnosis Date  . Anemia   . Heart murmur   . Primary thrombocytopenia ([place])   . Pulmonary embolism [facility])     Past Surgical History:  Procedure Laterality Date  . WISDOM TOOTH EXTRACTION      Current Medications: Current Meds  Medication Sig  . FEROSUL 325 (65 Fe) MG tablet 3 (three) times daily.  Marland Kitchen HYDROcodone-acetaminophen (NORCO/VICODIN) 5-325 MG tablet as directed.  . paragard intrauterine copper IUD IUD 1 Intra Uterine Device (1 each total) by Intrauterine route once for 1 dose.  . STOOL SOFTENER 100 MG capsule 2 (two) times daily.     Allergies:   Patient has no known allergies.   Social History   Socioeconomic History  . Marital status: Single    Spouse name: Not on file  . Number of children: Not on file  . Years of education: Not on file  . Highest education level: Not on file  Occupational History  . Not on file  Social Needs  . Financial resource strain: Not hard at all  . Food insecurity    Worry: Never true    Inability: Never true  . Transportation  needs    Medical: No    Non-medical: No  Tobacco Use  . Smoking status: Never Smoker  . Smokeless tobacco: Never Used  Substance and Sexual Activity  . Alcohol use: Never    Frequency: Never  . Drug use: Never  . Sexual activity: Yes  Lifestyle  . Physical activity    Days per week: Not on file    Minutes per session: Not on file  . Stress: Not on file  Relationships  . Social Herbalist on phone: Not on file    Gets together: Not on file    Attends religious service: Not on file    Active member of club or organization: Not on file    Attends meetings of clubs or organizations: Not on file    Relationship status: Not on file  Other Topics Concern  . Not on file  Social History Narrative  . Not on file     Family History: The patient's family history includes Diabetes in her maternal grandmother; Hypertension in her maternal grandfather.  ROS:   Please see the history of present illness.     All other systems reviewed and are negative.  EKGs/Labs/Other Studies Reviewed:    The following studies were reviewed today:   EKG:  EKG is  ordered today.  The ekg ordered today demonstrates normal  sinus rhythm, normal ECG.  Recent Labs: 03/26/2019: Hemoglobin 12.8; Platelets 175 03/30/2019: ALT 22; BUN 10; Creatinine, Ser 0.70; Potassium 4.2; Sodium 142  Recent Lipid Panel No results found for: CHOL, TRIG, HDL, CHOLHDL, VLDL, LDLCALC, LDLDIRECT  Physical Exam:    VS:  BP 98/64 (BP Location: Right Arm, Patient Position: Sitting, Cuff Size: Normal)   Pulse 63   Wt 126 lb 4 oz (57.3 kg)   SpO2 98%   BMI 22.22 kg/m     Wt Readings from Last 3 Encounters:  03/31/19 126 lb 4 oz (57.3 kg)  03/26/19 125 lb 12.8 oz (57.1 kg)  09/02/18 124 lb 9.6 oz (56.5 kg)     GEN:  Well nourished, well developed in no acute distress HEENT: Normal NECK: No JVD; No carotid bruits LYMPHATICS: No lymphadenopathy CARDIAC: RRR, faint1/6 systolic murmur, rubs, gallops  RESPIRATORY:  Clear to auscultation without rales, wheezing or rhonchi  ABDOMEN: Soft, non-tender, non-distended MUSCULOSKELETAL:  No edema; No deformity  SKIN: Warm and dry NEUROLOGIC:  Alert and oriented x 3 PSYCHIATRIC:  Normal affect   ASSESSMENT:   Patient with a very faint systolic murmur.  She has no symptoms whatsoever.  She has no history of cardiac disease or risk factors.  She is therefore at low cardiac risk for procedure.  The Revised Cardiac Risk Index indicates that her Perioperative Risk of Major Cardiac Event is (%): 0.4.  Therefore, she is at low risk for perioperative complications.    According to ACC/AHA guidelines, no further cardiovascular testing needed.  The patient may proceed to surgery at acceptable risk.      1. Heart murmur   2. Preop examination    PLAN:    In order of problems listed above:  1. Get echocardiogram to evaluate cardiac murmur.  I do not think cardiac murmur will play any role with regards to her having her surgery. 2. Patient is at a low cardiac risk for procedure.  She can proceed with surgery without any further cardiac testing.  If echo is normal, follow-up as needed.  Total encounter time more than 45 minutes  Greater than 50% was spent in counseling and coordination of care with the patient   Medication Adjustments/Labs and Tests Ordered: Current medicines are reviewed at length with the patient today.  Concerns regarding medicines are outlined above.  Orders Placed This Encounter  Procedures  . EKG 12-Lead  . ECHOCARDIOGRAM COMPLETE   No orders of the defined types were placed in this encounter.   Patient Instructions  Medication Instructions:  Your physician recommends that you continue on your current medications as directed. Please refer to the Current Medication list given to you today.  If you need a refill on your cardiac medications before your next appointment, please call your pharmacy.   Lab work: NONE If  you have labs (blood work) drawn today and your tests are completely normal, you will receive your results only by: Marland Kitchen MyChart Message (if you have MyChart) OR . A paper copy in the mail If you have any lab test that is abnormal or we need to change your treatment, we will call you to review the results.  Testing/Procedures: Your physician has requested that you have an echocardiogram. Echocardiography is a painless test that uses sound waves to create images of your heart. It provides your doctor with information about the size and shape of your heart and how well your heart's chambers and valves are working. This procedure takes approximately one  hour. There are no restrictions for this procedure. You may get an IV, if needed, to receive an ultrasound enhancing agent through to better visualize your heart.    Follow-Up: At [facility], you and your health needs are our priority.  As part of our continuing mission to provide you with exceptional heart care, we have created designated Provider Care Teams.  These Care Teams include your primary Cardiologist (physician) and Advanced Practice Providers (APPs -  Physician Assistants and Nurse Practitioners) who all work together to provide you with the care you need, when you need it. You will need a follow up appointment in as needed. You may see Debbe Odea, MD or one of the following Advanced Practice Providers on your designated Care Team:   Nicolasa Ducking, NP Eula Listen, PA-C . Marisue Ivan, PA-C    Echocardiogram An echocardiogram is a procedure that uses painless sound waves (ultrasound) to produce an image of the heart. Images from an echocardiogram can provide important information about:  Signs of coronary artery disease (CAD).  Aneurysm detection. An aneurysm is a weak or damaged part of an artery wall that bulges out from the normal force of blood pumping through the body.  Heart size and shape. Changes in the size or  shape of the heart can be associated with certain conditions, including heart failure, aneurysm, and CAD.  Heart muscle function.  Heart valve function.  Signs of a past heart attack.  Fluid buildup around the heart.  Thickening of the heart muscle.  A tumor or infectious growth around the heart valves. Tell a health care provider about:  Any allergies you have.  All medicines you are taking, including vitamins, herbs, eye drops, creams, and over-the-counter medicines.  Any blood disorders you have.  Any surgeries you have had.  Any medical conditions you have.  Whether you are pregnant or may be pregnant. What are the risks? Generally, this is a safe procedure. However, problems may occur, including:  Allergic reaction to dye (contrast) that may be used during the procedure. What happens before the procedure? No specific preparation is needed. You may eat and drink normally. What happens during the procedure?   An IV tube may be inserted into one of your veins.  You may receive contrast through this tube. A contrast is an injection that improves the quality of the pictures from your heart.  A gel will be applied to your chest.  A wand-like tool (transducer) will be moved over your chest. The gel will help to transmit the sound waves from the transducer.  The sound waves will harmlessly bounce off of your heart to allow the heart images to be captured in real-time motion. The images will be recorded on a computer. The procedure may vary among health care providers and hospitals. What happens after the procedure?  You may return to your normal, everyday life, including diet, activities, and medicines, unless your health care provider tells you not to do that. Summary  An echocardiogram is a procedure that uses painless sound waves (ultrasound) to produce an image of the heart.  Images from an echocardiogram can provide important information about the size and shape  of your heart, heart muscle function, heart valve function, and fluid buildup around your heart.  You do not need to do anything to prepare before this procedure. You may eat and drink normally.  After the echocardiogram is completed, you may return to your normal, everyday life, unless your health care provider tells  you not to do that. This information is not intended to replace advice given to you by your health care provider. Make sure you discuss any questions you have with your health care provider. Document Released: 06/01/2000 Document Revised: 09/25/2018 Document Reviewed: 07/07/2016 Elsevier Patient Education  2020 [employer]       Signed, Debbe OdeaBrian Agbor-Etang, MD  03/31/2019 11:29 AM    [facility]

## 2019-04-01 NOTE — Telephone Encounter (Signed)
Yes, I know. I was waiting for her to get her CMP done, so I can send all her labs together to her surgeon.

## 2019-04-02 ENCOUNTER — Telehealth: Payer: Self-pay

## 2019-04-02 ENCOUNTER — Encounter: Payer: Self-pay | Admitting: Internal Medicine

## 2019-04-02 ENCOUNTER — Other Ambulatory Visit: Payer: Self-pay | Admitting: Internal Medicine

## 2019-04-02 NOTE — Telephone Encounter (Signed)
Called pt to inform her that her letter from sylvia and last office note has been faxed over to the plastic surgeon office

## 2019-04-02 NOTE — Telephone Encounter (Signed)
Called Diana Lowe to tell her per Sunday Spillers Diana Lowe would need to see cardiologist before any clearance. Diana Lowe seen cardiologist Tuesday note in Epic Told Diana Lowe I would call her back after Sunday Spillers reviews the information

## 2019-04-02 NOTE — Progress Notes (Signed)
Please fax her labs to her surgeon in [place] at [phone]

## 2019-04-02 NOTE — Telephone Encounter (Signed)
I am waiting for the name of the clinic and the surgeon to address the clearance letter to. I sent her a Pharmacist, community message.  Sunday Spillers

## 2019-04-03 ENCOUNTER — Ambulatory Visit: Admission: RE | Admit: 2019-04-03 | Payer: Medicaid Other | Source: Ambulatory Visit

## 2019-07-07 ENCOUNTER — Encounter: Payer: Medicaid Other | Admitting: Internal Medicine

## 2019-07-08 ENCOUNTER — Telehealth: Payer: Self-pay

## 2019-07-08 NOTE — Telephone Encounter (Signed)
CALLED PT TO ADVISE ABOUT APPT CHANGE NO ANS LVM

## 2019-07-09 ENCOUNTER — Encounter: Payer: Medicaid Other | Admitting: Internal Medicine

## 2019-08-06 ENCOUNTER — Encounter: Payer: Medicaid Other | Admitting: Internal Medicine

## 2019-08-12 ENCOUNTER — Encounter: Payer: Medicaid Other | Admitting: Internal Medicine
